# Patient Record
Sex: Female | Born: 1935
Health system: Southern US, Community
[De-identification: ages and names within clinical notes are randomized; demographics above are authoritative.]

## PROBLEM LIST (undated history)

## (undated) DIAGNOSIS — I10 Essential (primary) hypertension: Secondary | ICD-10-CM

## (undated) DIAGNOSIS — M797 Fibromyalgia: Secondary | ICD-10-CM

## (undated) DIAGNOSIS — I4891 Unspecified atrial fibrillation: Secondary | ICD-10-CM

## (undated) HISTORY — PX: ABDOMINAL HYSTERECTOMY: SHX81

## (undated) HISTORY — DX: Fibromyalgia: M79.7

## (undated) HISTORY — PX: APPENDECTOMY: SHX54

## (undated) HISTORY — DX: Essential (primary) hypertension: I10

## (undated) HISTORY — PX: TONSILLECTOMY: SUR1361

## (undated) HISTORY — PX: BREAST BIOPSY: SHX20

## (undated) HISTORY — DX: Unspecified atrial fibrillation: I48.91

---

## 2012-09-15 ENCOUNTER — Ambulatory Visit (HOSPITAL_BASED_OUTPATIENT_CLINIC_OR_DEPARTMENT_OTHER)
Admission: RE | Admit: 2012-09-15 | Discharge: 2012-09-15 | Disposition: A | Discharge status: Home / Self Care | Payer: Medicare Other | Source: Ambulatory Visit | Attending: Family Medicine | Admitting: Family Medicine

## 2012-09-15 ENCOUNTER — Other Ambulatory Visit (HOSPITAL_BASED_OUTPATIENT_CLINIC_OR_DEPARTMENT_OTHER): Payer: Self-pay | Admitting: Family Medicine

## 2012-09-15 DIAGNOSIS — M79609 Pain in unspecified limb: Secondary | ICD-10-CM | POA: Insufficient documentation

## 2012-09-15 DIAGNOSIS — M2010 Hallux valgus (acquired), unspecified foot: Secondary | ICD-10-CM | POA: Insufficient documentation

## 2013-04-22 ENCOUNTER — Other Ambulatory Visit: Payer: Self-pay | Admitting: Family Medicine

## 2013-04-28 ENCOUNTER — Other Ambulatory Visit: Payer: Self-pay | Admitting: *Deleted

## 2013-04-28 MED ORDER — ATENOLOL 25 MG PO TABS: 25.0000 mg | ORAL_TABLET | Freq: Every day | ORAL | Status: DC

## 2013-04-28 NOTE — Telephone Encounter (Signed)
Patient has ran out of her Atenolol.  She is schedule to come soon for labs and OV.

## 2013-04-30 ENCOUNTER — Ambulatory Visit: Payer: Medicare Other | Admitting: *Deleted

## 2013-04-30 DIAGNOSIS — E785 Hyperlipidemia, unspecified: Secondary | ICD-10-CM

## 2013-04-30 DIAGNOSIS — R5381 Other malaise: Secondary | ICD-10-CM

## 2013-04-30 LAB — COMPLETE METABOLIC PANEL WITH GFR
ALT: 13 U/L (ref 0–35)
AST: 18 U/L (ref 0–37)
Albumin: 4.3 g/dL (ref 3.5–5.2)
Alkaline Phosphatase: 58 U/L (ref 39–117)
BUN: 14 mg/dL (ref 6–23)
CO2: 31 mEq/L (ref 19–32)
Calcium: 9.3 mg/dL (ref 8.4–10.5)
Chloride: 101 mEq/L (ref 96–112)
Creat: 0.88 mg/dL (ref 0.50–1.10)
GFR, Est African American: 74 mL/min
GFR, Est Non African American: 64 mL/min
Glucose, Bld: 82 mg/dL (ref 70–99)
Potassium: 4.5 mEq/L (ref 3.5–5.3)
Sodium: 140 mEq/L (ref 135–145)
Total Bilirubin: 0.5 mg/dL (ref 0.3–1.2)
Total Protein: 6.7 g/dL (ref 6.0–8.3)

## 2013-04-30 LAB — LIPID PANEL
Cholesterol: 196 mg/dL (ref 0–200)
HDL: 50 mg/dL (ref >39)
LDL Cholesterol: 114 mg/dL — ABNORMAL HIGH (ref 0–99)
Total CHOL/HDL Ratio: 3.9 Ratio
Triglycerides: 159 mg/dL — ABNORMAL HIGH (ref <150)
VLDL: 32 mg/dL (ref 0–40)

## 2013-05-04 ENCOUNTER — Ambulatory Visit (INDEPENDENT_AMBULATORY_CARE_PROVIDER_SITE_OTHER): Payer: Medicare Other | Admitting: Family Medicine

## 2013-05-04 ENCOUNTER — Encounter: Payer: Self-pay | Admitting: Family Medicine

## 2013-05-04 VITALS — BP 171/72 | HR 50 | Ht 62.0 in | Wt 148.0 lb

## 2013-05-04 DIAGNOSIS — I10 Essential (primary) hypertension: Secondary | ICD-10-CM

## 2013-05-04 DIAGNOSIS — M797 Fibromyalgia: Secondary | ICD-10-CM

## 2013-05-04 DIAGNOSIS — E559 Vitamin D deficiency, unspecified: Secondary | ICD-10-CM

## 2013-05-04 DIAGNOSIS — E785 Hyperlipidemia, unspecified: Secondary | ICD-10-CM

## 2013-05-04 DIAGNOSIS — IMO0001 Reserved for inherently not codable concepts without codable children: Secondary | ICD-10-CM

## 2013-05-04 DIAGNOSIS — Z780 Asymptomatic menopausal state: Secondary | ICD-10-CM

## 2013-05-04 MED ORDER — LISINOPRIL-HYDROCHLOROTHIAZIDE 20-12.5 MG PO TABS: 1.0000 | ORAL_TABLET | Freq: Every day | ORAL | Status: DC

## 2013-05-04 MED ORDER — TIZANIDINE HCL 4 MG PO TABS: ORAL_TABLET | ORAL | Status: DC

## 2013-05-04 MED ORDER — TRAMADOL HCL 50 MG PO TABS: 50.0000 mg | ORAL_TABLET | Freq: Four times a day (QID) | ORAL | Status: DC | PRN

## 2013-05-04 MED ORDER — ATENOLOL 25 MG PO TABS: 25.0000 mg | ORAL_TABLET | Freq: Every day | ORAL | Status: DC

## 2013-05-04 MED ORDER — DULOXETINE HCL 20 MG PO CPEP: 20.0000 mg | ORAL_CAPSULE | Freq: Every day | ORAL | Status: DC

## 2013-05-04 MED ORDER — ERGOCALCIFEROL 1.25 MG (50000 UT) PO CAPS: 50000.0000 [IU] | ORAL_CAPSULE | ORAL | Status: DC

## 2013-05-04 NOTE — Progress Notes (Signed)
  Subjective:    Patient ID: Julie Ramos, female    DOB: September 12, 1934, 79 y.o.   MRN: [id number]  HPI  Julie Ramos is here today to go over her most recent lab results and to discuss the issues below.  She needs to have all of her medications refilled.    1)  Hypertension:  She has done well on the combination of atenolol and lisinopril/HCTZ.  She needs refill on both.   2)  Stress:  Work continues to be stressful for her. She is hoping that she can retire soon.     3)  Fibromyalgia:  Her generalized pain is pretty well controlled on tizanidine and tramadol.  She needs refills on both medicaitons.    Review of Systems  Constitutional: Negative for fatigue.  HENT: Negative for trouble swallowing.   Eyes: Negative for visual disturbance.  Respiratory: Negative for cough.   Musculoskeletal: Positive for myalgias and arthralgias. Negative for back pain and joint swelling.       Her pain is better than in the past but she still has some mild pain especially when she has to work more and when she is under more stress    Skin: Negative for color change and rash.  Neurological: Negative for light-headedness.  Psychiatric/Behavioral: The patient is not nervous/anxious.     Past Medical History  Diagnosis Date  . Hypertension   . Fibromyalgia   . Atrial fibrillation     Family History  Problem Relation Age of Onset  . Hypertension Mother   . 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 No distress.  HENT:  Head: Normocephalic.  Eyes: No scleral  icterus.  Neck: No thyromegaly present.  Cardiovascular: Normal rate, regular rhythm and normal heart sounds.   Pulmonary/Chest: Effort normal and breath sounds normal.  Abdominal: There is no tenderness.  Musculoskeletal: She exhibits no edema and no tenderness.  Neurological: She is alert.  Skin: Skin is warm and dry.  Psychiatric: She has a normal mood and affect. Her behavior is normal. Judgment and thought content normal.          Assessment & Plan:

## 2013-05-04 NOTE — Patient Instructions (Addendum)
1)  Muscle Aches/Pain - Hot Bath with Epsom Salt   2)  BP - Limit your intake of sodium and increase your lisinopril HCT to 1 pill.  3)  Cholesterol - Decrease red meat and increase your fiber.    Fat and Cholesterol Control Diet Cholesterol levels in your body are determined significantly by your diet. Cholesterol levels may also be related to heart disease. The following material helps to explain this relationship and discusses what you can do to help keep your heart healthy. Not all cholesterol is bad. Low-density lipoprotein (LDL) cholesterol is the "bad" cholesterol. It may cause fatty deposits to build up inside your arteries. High-density lipoprotein (HDL) cholesterol is "good." It helps to remove the "bad" LDL cholesterol from your blood. Cholesterol is a very important risk factor for heart disease. Other risk factors are high blood pressure, smoking, stress, heredity, and weight. The heart muscle gets its supply of blood through the coronary arteries. If your LDL cholesterol is high and your HDL cholesterol is low, you are at risk for having fatty deposits build up in your coronary arteries. This leaves less room through which blood can flow. Without sufficient blood and oxygen, the heart muscle cannot function properly and you may feel chest pains (angina pectoris). When a coronary artery closes up entirely, a part of the heart muscle may die causing a heart attack (myocardial infarction). CHECKING CHOLESTEROL When your caregiver sends your blood to a lab to be examined for cholesterol, a complete lipid (fat) profile may be done. With this test, the total amount of cholesterol and levels of LDL and HDL are determined. Triglycerides are a type of fat that circulates in the blood. They can also be used to determine heart disease risk. The list below describes what the numbers should be: Test: Total Cholesterol.  Less than 200 mg/dl. Test: LDL "bad cholesterol."  Less than 100 mg/dl.  Less  than 70 mg/dl if you are at very high risk of a heart attack or sudden cardiac death. Test: HDL "good cholesterol."  Greater than 50 mg/dl for women.  Greater than 40 mg/dl for men. Test: Triglycerides.  Less than 150 mg/dl. CONTROLLING CHOLESTEROL WITH DIET Although exercise and lifestyle factors are important, your diet is key. That is because certain foods are known to raise cholesterol and others to lower it. The goal is to balance foods for their effect on cholesterol and more importantly, to replace saturated and trans fat with other types of fat, such as monounsaturated fat, polyunsaturated fat, and omega-3 fatty acids. On average, a person should consume no more than 15 to 17 g of saturated fat daily. Saturated and trans fats are considered "bad" fats, and they will raise LDL cholesterol. Saturated fats are primarily found in animal products such as meats, butter, and cream. However, that does not mean you need to give up all your favorite foods. Today, there are good tasting, low-fat, low-cholesterol substitutes for most of the things you like to eat. Choose low-fat or nonfat alternatives. Choose round or loin cuts of red meat. These types of cuts are lowest in fat and cholesterol. Chicken (without the skin), fish, veal, and ground [place] breast are great choices. Eliminate fatty meats, such as hot dogs and salami. Even shellfish have little or no saturated fat. Have a 3 oz (85 g) portion when you eat lean meat, poultry, or fish. Trans fats are also called "partially hydrogenated oils." They are oils that have been scientifically manipulated so that they  are solid at room temperature resulting in a longer shelf life and improved taste and texture of foods in which they are added. Trans fats are found in stick margarine, some tub margarines, cookies, crackers, and baked goods.  When baking and cooking, oils are a great substitute for butter. The monounsaturated oils are especially beneficial  since it is believed they lower LDL and raise HDL. The oils you should avoid entirely are saturated tropical oils, such as coconut and palm.  Remember to eat a lot from food groups that are naturally free of saturated and trans fat, including fish, fruit, vegetables, beans, grains (barley, rice, couscous, bulgur wheat), and pasta (without cream sauces).  IDENTIFYING FOODS THAT LOWER CHOLESTEROL  Soluble fiber may lower your cholesterol. This type of fiber is found in fruits such as apples, vegetables such as broccoli, potatoes, and carrots, legumes such as beans, peas, and lentils, and grains such as barley. Foods fortified with plant sterols (phytosterol) may also lower cholesterol. You should eat at least 2 g per day of these foods for a cholesterol lowering effect.  Read package labels to identify low-saturated fats, trans fat free, and low-fat foods at the supermarket. Select cheeses that have only 2 to 3 g saturated fat per ounce. Use a heart-healthy tub margarine that is free of trans fats or partially hydrogenated oil. When buying baked goods (cookies, crackers), avoid partially hydrogenated oils. Breads and muffins should be made from whole grains (whole-wheat or whole oat flour, instead of "flour" or "enriched flour"). Buy non-creamy canned soups with reduced salt and no added fats.  FOOD PREPARATION TECHNIQUES  Never deep-fry. If you must fry, either stir-fry, which uses very little fat, or use non-stick cooking sprays. When possible, broil, bake, or roast meats, and steam vegetables. Instead of putting butter or margarine on vegetables, use lemon and herbs, applesauce, and cinnamon (for squash and sweet potatoes), nonfat yogurt, salsa, and low-fat dressings for salads.  LOW-SATURATED FAT / LOW-FAT FOOD SUBSTITUTES Meats / Saturated Fat (g)  Avoid: Steak, marbled (3 oz/85 g) / 11 g  Choose: Steak, lean (3 oz/85 g) / 4 g  Avoid: Hamburger (3 oz/85 g) / 7 g  Choose: Hamburger, lean (3 oz/85  g) / 5 g  Avoid: Ham (3 oz/85 g) / 6 g  Choose: Ham, lean cut (3 oz/85 g) / 2.4 g  Avoid: Chicken, with skin, dark meat (3 oz/85 g) / 4 g  Choose: Chicken, skin removed, dark meat (3 oz/85 g) / 2 g  Avoid: Chicken, with skin, light meat (3 oz/85 g) / 2.5 g  Choose: Chicken, skin removed, light meat (3 oz/85 g) / 1 g Dairy / Saturated Fat (g)  Avoid: Whole milk (1 cup) / 5 g  Choose: Low-fat milk, 2% (1 cup) / 3 g  Choose: Low-fat milk, 1% (1 cup) / 1.5 g  Choose: Skim milk (1 cup) / 0.3 g  Avoid: Hard cheese (1 oz/28 g) / 6 g  Choose: Skim milk cheese (1 oz/28 g) / 2 to 3 g  Avoid: Cottage cheese, 4% fat (1 cup) / 6.5 g  Choose: Low-fat cottage cheese, 1% fat (1 cup) / 1.5 g  Avoid: Ice cream (1 cup) / 9 g  Choose: Sherbet (1 cup) / 2.5 g  Choose: Nonfat frozen yogurt (1 cup) / 0.3 g  Choose: Frozen fruit bar / trace  Avoid: Whipped cream (1 tbs) / 3.5 g  Choose: Nondairy whipped topping (1 tbs) / 1 g Condiments /  Saturated Fat (g)  Avoid: Mayonnaise (1 tbs) / 2 g  Choose: Low-fat mayonnaise (1 tbs) / 1 g  Avoid: Butter (1 tbs) / 7 g  Choose: Extra light margarine (1 tbs) / 1 g  Avoid: Coconut oil (1 tbs) / 11.8 g  Choose: Olive oil (1 tbs) / 1.8 g  Choose: Corn oil (1 tbs) / 1.7 g  Choose: Safflower oil (1 tbs) / 1.2 g  Choose: Sunflower oil (1 tbs) / 1.4 g  Choose: Soybean oil (1 tbs) / 2.4 g  Choose: Canola oil (1 tbs) / 1 g Document Released: 11/11/2005 Document Revised: 02/03/2012 Document Reviewed: 05/02/2011 [employer] Patient Information 2014 [place], [place].

## 2013-05-04 NOTE — Progress Notes (Deleted)
  Subjective:    Patient ID: Julie Ramos, female    DOB: December 13, 1933, 79 y.o.   MRN: [id number]  HPI    Review of Systems  Constitutional: Negative.   HENT: Negative.   Eyes: Negative.   Respiratory: Negative.   Cardiovascular: Negative.   Endocrine: Negative.   Genitourinary: Negative.   Musculoskeletal: Positive for myalgias.  Skin: Negative.   Allergic/Immunologic: Negative.   Neurological: Negative.   Hematological: Negative.   Psychiatric/Behavioral: Negative.        Objective:   Physical Exam        Assessment & Plan:

## 2013-05-07 ENCOUNTER — Encounter: Payer: Medicare Other | Admitting: Family Medicine

## 2013-05-22 DIAGNOSIS — IMO0001 Reserved for inherently not codable concepts without codable children: Secondary | ICD-10-CM | POA: Insufficient documentation

## 2013-05-22 DIAGNOSIS — Z780 Asymptomatic menopausal state: Secondary | ICD-10-CM | POA: Insufficient documentation

## 2013-05-22 DIAGNOSIS — I10 Essential (primary) hypertension: Secondary | ICD-10-CM | POA: Insufficient documentation

## 2013-05-22 DIAGNOSIS — E785 Hyperlipidemia, unspecified: Secondary | ICD-10-CM | POA: Insufficient documentation

## 2013-05-22 DIAGNOSIS — M797 Fibromyalgia: Secondary | ICD-10-CM | POA: Insufficient documentation

## 2013-05-22 DIAGNOSIS — E559 Vitamin D deficiency, unspecified: Secondary | ICD-10-CM | POA: Insufficient documentation

## 2013-05-22 IMAGING — CR DG FOOT COMPLETE 3+V*L*
3 series · 3 of 3 positions shown · non-contrast
Comparison: None.

CLINICAL DATA: History of injury with contusion and pain.

LEFT FOOT - COMPLETE 3+ VIEW

[t foot ap left]
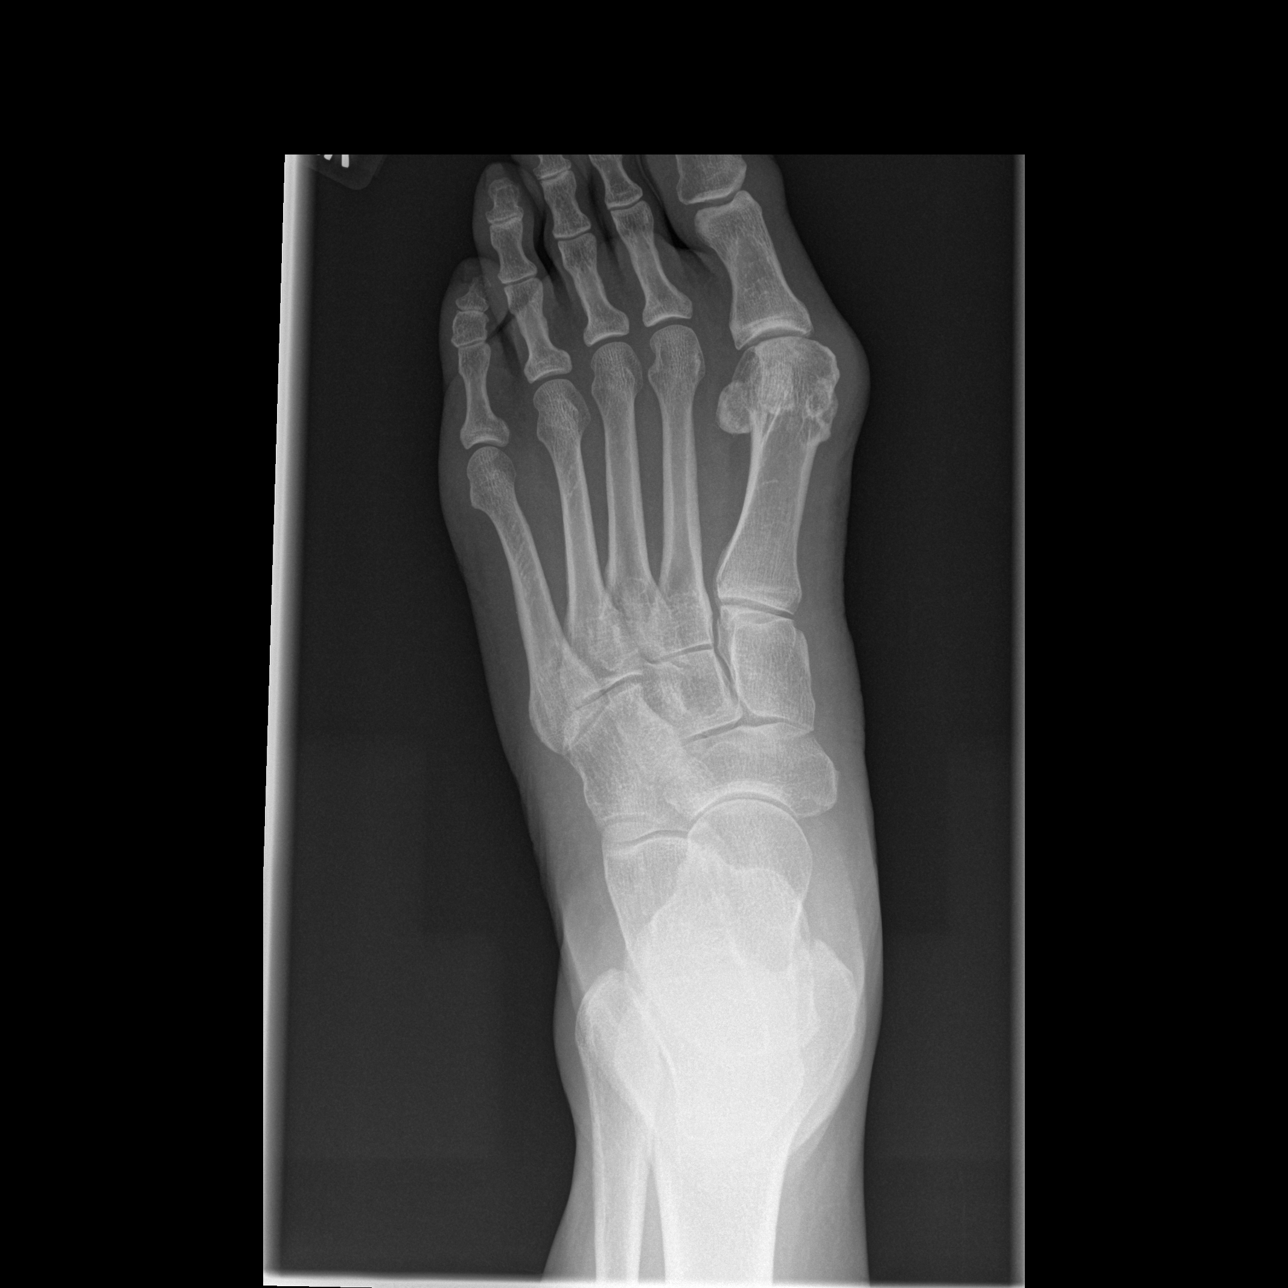

[t foot oblique left]
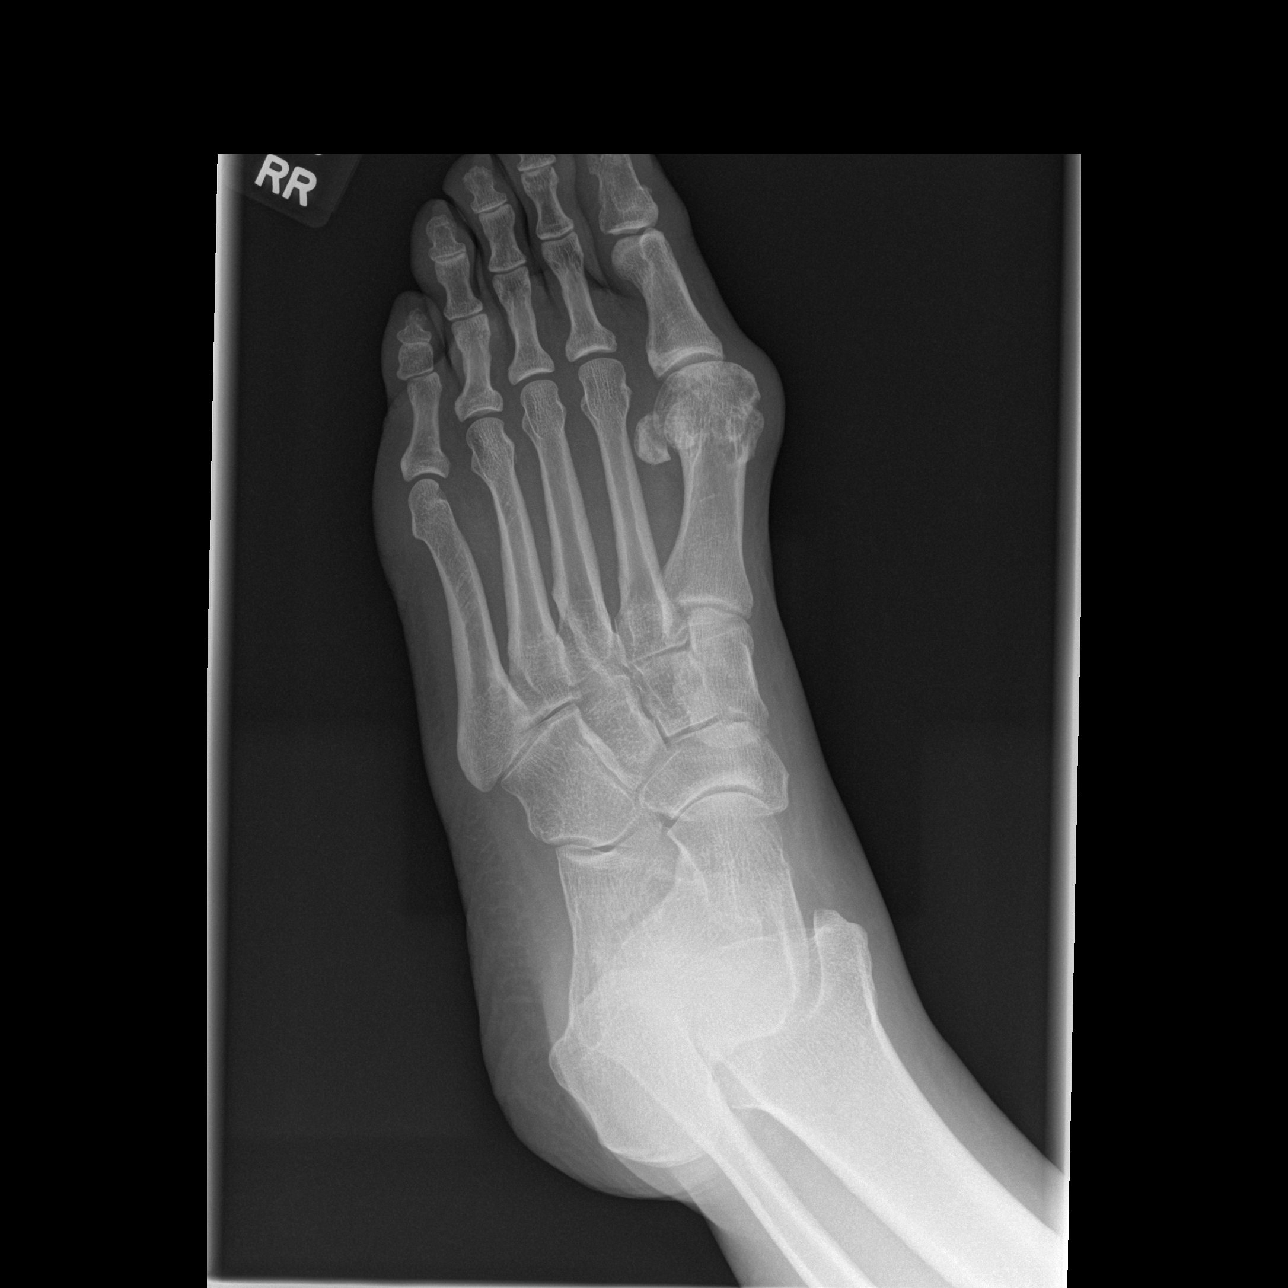

[t foot lat left]
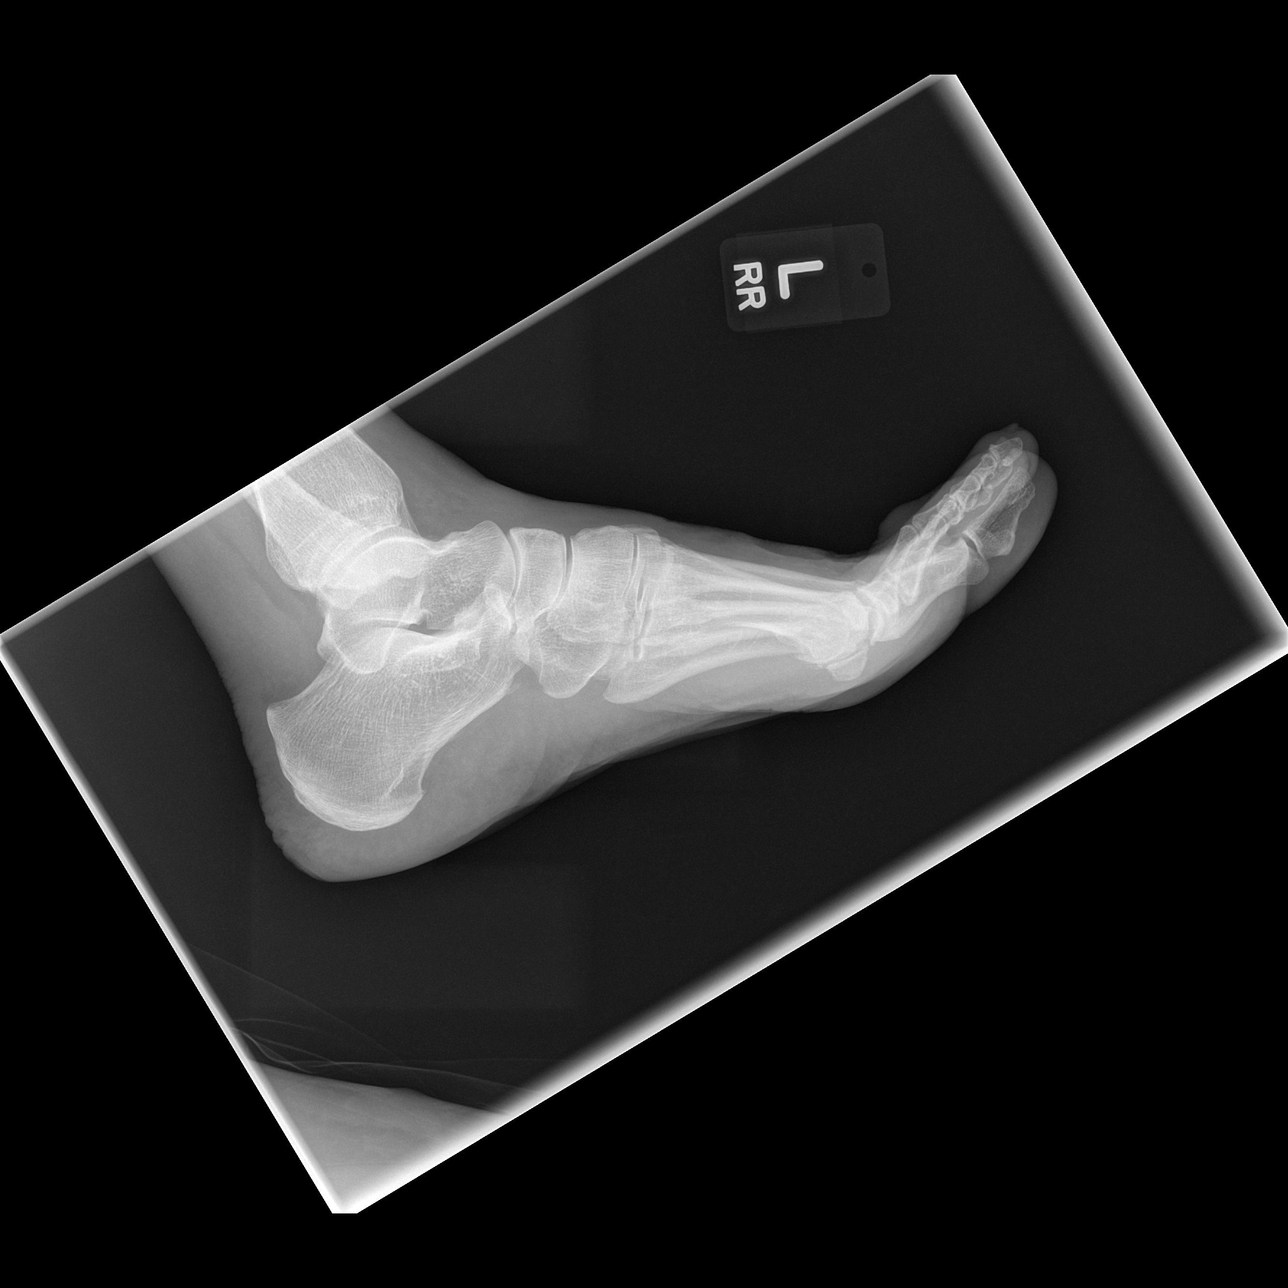

[3 of 3 positions shown; findings below may reference images not displayed]

FINDINGS: There is first metatarsal varus - hallux valgus
configuration with medial soft tissue thickening.  No fracture or
dislocation is evident.
IMPRESSION: Hallux valgus configuration.  No fracture or dislocation.

## 2013-05-22 NOTE — Assessment & Plan Note (Signed)
Refilled her Vitamin D.   

## 2013-05-22 NOTE — Assessment & Plan Note (Signed)
Her LDL is slightly elevated. She is to decrease her intake of red meat and increase her intake of fiber.

## 2013-05-22 NOTE — Assessment & Plan Note (Signed)
She is going to try a low dosage of Cymbalta to see if this will give her additional help with her pain.

## 2013-05-22 NOTE — Assessment & Plan Note (Signed)
Refilled her tramadol and tizanidine

## 2013-05-22 NOTE — Assessment & Plan Note (Addendum)
Her BP is higher than normal today.  She is to watch her sodium intake more closely. She is to also increase her lisinopril HCT to a full pill. She was given refills for both of her BP medications.

## 2013-05-22 NOTE — Assessment & Plan Note (Signed)
She is going to hold her HRT for now since she seems to be doing well.

## 2013-06-22 ENCOUNTER — Ambulatory Visit (INDEPENDENT_AMBULATORY_CARE_PROVIDER_SITE_OTHER): Payer: Medicare Other | Admitting: Family Medicine

## 2013-06-22 ENCOUNTER — Encounter: Payer: Self-pay | Admitting: Family Medicine

## 2013-06-22 VITALS — BP 144/80 | HR 53 | Resp 16 | Ht 62.0 in | Wt 147.0 lb

## 2013-06-22 DIAGNOSIS — I781 Nevus, non-neoplastic: Secondary | ICD-10-CM

## 2013-06-22 NOTE — Progress Notes (Signed)
  Subjective:    Patient ID: Julie Ramos, female    DOB: [birth year], 79 y.o.   MRN: [id number]  HPI  Julie Bible is here today concerned about a dark mole on her chest. She has had it for some time but feels that it is getting larger so she wants to have it checked. She also has an area under her right eye that she is concerned about. It has been there for years but recently noticed that it also is growing.    Review of Systems  Constitutional: Negative.   HENT: Negative.   Eyes:       Skin tag under right eye  Respiratory: Negative.   Cardiovascular: Negative.   Gastrointestinal: Negative.   Endocrine: Negative.   Genitourinary: Negative.   Musculoskeletal: Negative.   Skin:       Dark mole on chest  Allergic/Immunologic: Negative.   Neurological: Negative.   Hematological: Negative.   Psychiatric/Behavioral: Negative.     Past Medical History  Diagnosis Date  . Hypertension   . Fibromyalgia   . Atrial fibrillation     Family History  Problem Relation Age of Onset  . Hypertension Mother   . 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 No distress.  Skin: Skin is warm and dry.  She has one skin lesion on chest and one under right eye.        Assessment & Plan:

## 2013-08-01 DIAGNOSIS — I781 Nevus, non-neoplastic: Secondary | ICD-10-CM | POA: Insufficient documentation

## 2013-08-01 NOTE — Assessment & Plan Note (Signed)
She was given Dr. Stefanie Libel' address and phone number. She is to call and schedule an appointment to have these lesions checked since they are changing.

## 2013-08-01 NOTE — Patient Instructions (Addendum)
1)  Skin Lesion:  Call [facility] and set up an appointment with Dr. Stefanie Libel.

## 2013-08-24 ENCOUNTER — Ambulatory Visit (INDEPENDENT_AMBULATORY_CARE_PROVIDER_SITE_OTHER): Payer: Medicare Other | Admitting: Family Medicine

## 2013-08-24 VITALS — BP 130/80 | HR 54 | Resp 16 | Ht 61.25 in | Wt 145.0 lb

## 2013-08-24 DIAGNOSIS — Z0000 Encounter for general adult medical examination without abnormal findings: Secondary | ICD-10-CM

## 2013-08-24 DIAGNOSIS — Z23 Encounter for immunization: Secondary | ICD-10-CM

## 2013-08-24 DIAGNOSIS — IMO0001 Reserved for inherently not codable concepts without codable children: Secondary | ICD-10-CM

## 2013-08-24 LAB — POCT URINALYSIS DIPSTICK
Bilirubin, UA: NEGATIVE
Blood, UA: NEGATIVE
Glucose, UA: NEGATIVE
Ketones, UA: NEGATIVE
Leukocytes, UA: NEGATIVE
Nitrite, UA: NEGATIVE
Protein, UA: NEGATIVE
Spec Grav, UA: 1.02
Urobilinogen, UA: NEGATIVE
pH, UA: 5

## 2013-08-24 NOTE — Patient Instructions (Addendum)
Preventive Care for Adults, Female A healthy lifestyle and preventive care can promote health and wellness. Preventive health guidelines for women include the following key practices.  A routine yearly physical is a good way to check with your caregiver about your health and preventive screening. It is a chance to share any concerns and updates on your health, and to receive a thorough exam.  Visit your dentist for a routine exam and preventive care every 6 months. Brush your teeth twice a day and floss once a day. Good oral hygiene prevents tooth decay and gum disease.  The frequency of eye exams is based on your age, health, family medical history, use of contact lenses, and other factors. Follow your caregiver's recommendations for frequency of eye exams.  Eat a healthy diet. Foods like vegetables, fruits, whole grains, low-fat dairy products, and lean protein foods contain the nutrients you need without too many calories. Decrease your intake of foods high in solid fats, added sugars, and salt. Eat the right amount of calories for you.Get information about a proper diet from your caregiver, if necessary.  Regular physical exercise is one of the most important things you can do for your health. Most adults should get at least 150 minutes of moderate-intensity exercise (any activity that increases your heart rate and causes you to sweat) each week. In addition, most adults need muscle-strengthening exercises on 2 or more days a week.  Maintain a healthy weight. The body mass index (BMI) is a screening tool to identify possible weight problems. It provides an estimate of body fat based on height and weight. Your caregiver can help determine your BMI, and can help you achieve or maintain a healthy weight.For adults 20 years and older:  A BMI below 18.5 is considered underweight.  A BMI of 18.5 to 24.9 is normal.  A BMI of 25 to 29.9 is considered overweight.  A BMI of 30 and above is  considered obese.  Maintain normal blood lipids and cholesterol levels by exercising and minimizing your intake of saturated fat. Eat a balanced diet with plenty of fruit and vegetables. Blood tests for lipids and cholesterol should begin at age 20 and be repeated every 5 years. If your lipid or cholesterol levels are high, you are over 50, or you are at high risk for heart disease, you may need your cholesterol levels checked more frequently.Ongoing high lipid and cholesterol levels should be treated with medicines if diet and exercise are not effective.  If you smoke, find out from your caregiver how to quit. If you do not use tobacco, do not start.  If you are pregnant, do not drink alcohol. If you are breastfeeding, be very cautious about drinking alcohol. If you are not pregnant and choose to drink alcohol, do not exceed 1 drink per day. One drink is considered to be 12 ounces (355 mL) of beer, 5 ounces (148 mL) of wine, or 1.5 ounces (44 mL) of liquor.  Avoid use of street drugs. Do not share needles with anyone. Ask for help if you need support or instructions about stopping the use of drugs.  High blood pressure causes heart disease and increases the risk of stroke. Your blood pressure should be checked at least every 1 to 2 years. Ongoing high blood pressure should be treated with medicines if weight loss and exercise are not effective.  If you are 55 to 77 years old, ask your caregiver if you should take aspirin to prevent strokes.  Diabetes   screening involves taking a blood sample to check your fasting blood sugar level. This should be done once every 3 years, after age 45, if you are within normal weight and without risk factors for diabetes. Testing should be considered at a younger age or be carried out more frequently if you are overweight and have at least 1 risk factor for diabetes.  Breast cancer screening is essential preventive care for women. You should practice "breast  self-awareness." This means understanding the normal appearance and feel of your breasts and may include breast self-examination. Any changes detected, no matter how small, should be reported to a caregiver. Women in their 20s and 30s should have a clinical breast exam (CBE) by a caregiver as part of a regular health exam every 1 to 3 years. After age 40, women should have a CBE every year. Starting at age 40, women should consider having a mammography (breast X-ray test) every year. Women who have a family history of breast cancer should talk to their caregiver about genetic screening. Women at a high risk of breast cancer should talk to their caregivers about having magnetic resonance imaging (MRI) and a mammography every year.  The Pap test is a screening test for cervical cancer. A Pap test can show cell changes on the cervix that might become cervical cancer if left untreated. A Pap test is a procedure in which cells are obtained and examined from the lower end of the uterus (cervix).  Women should have a Pap test starting at age 21.  Between ages 21 and 29, Pap tests should be repeated every 2 years.  Beginning at age 30, you should have a Pap test every 3 years as long as the past 3 Pap tests have been normal.  Some women have medical problems that increase the chance of getting cervical cancer. Talk to your caregiver about these problems. It is especially important to talk to your caregiver if a new problem develops soon after your last Pap test. In these cases, your caregiver may recommend more frequent screening and Pap tests.  The above recommendations are the same for women who have or have not gotten the vaccine for human papillomavirus (HPV).  If you had a hysterectomy for a problem that was not cancer or a condition that could lead to cancer, then you no longer need Pap tests. Even if you no longer need a Pap test, a regular exam is a good idea to make sure no other problems are  starting.  If you are between ages 65 and 70, and you have had normal Pap tests going back 10 years, you no longer need Pap tests. Even if you no longer need a Pap test, a regular exam is a good idea to make sure no other problems are starting.  If you have had past treatment for cervical cancer or a condition that could lead to cancer, you need Pap tests and screening for cancer for at least 20 years after your treatment.  If Pap tests have been discontinued, risk factors (such as a new sexual partner) need to be reassessed to determine if screening should be resumed.  The HPV test is an additional test that may be used for cervical cancer screening. The HPV test looks for the virus that can cause the cell changes on the cervix. The cells collected during the Pap test can be tested for HPV. The HPV test could be used to screen women aged 30 years and older, and should   be used in women of any age who have unclear Pap test results. After the age of 30, women should have HPV testing at the same frequency as a Pap test.  Colorectal cancer can be detected and often prevented. Most routine colorectal cancer screening begins at the age of 50 and continues through age 75. However, your caregiver may recommend screening at an earlier age if you have risk factors for colon cancer. On a yearly basis, your caregiver may provide home test kits to check for hidden blood in the stool. Use of a small camera at the end of a tube, to directly examine the colon (sigmoidoscopy or colonoscopy), can detect the earliest forms of colorectal cancer. Talk to your caregiver about this at age 50, when routine screening begins. Direct examination of the colon should be repeated every 5 to 10 years through age 75, unless early forms of pre-cancerous polyps or small growths are found.  Hepatitis C blood testing is recommended for all people born from 1945 through 1965 and any individual with known risks for hepatitis C.  Practice  safe sex. Use condoms and avoid high-risk sexual practices to reduce the spread of sexually transmitted infections (STIs). STIs include gonorrhea, chlamydia, syphilis, trichomonas, herpes, HPV, and human immunodeficiency virus (HIV). Herpes, HIV, and HPV are viral illnesses that have no cure. They can result in disability, cancer, and death. Sexually active women aged 25 and younger should be checked for chlamydia. Older women with new or multiple partners should also be tested for chlamydia. Testing for other STIs is recommended if you are sexually active and at increased risk.  Osteoporosis is a disease in which the bones lose minerals and strength with aging. This can result in serious bone fractures. The risk of osteoporosis can be identified using a bone density scan. Women ages 65 and over and women at risk for fractures or osteoporosis should discuss screening with their caregivers. Ask your caregiver whether you should take a calcium supplement or vitamin D to reduce the rate of osteoporosis.  Menopause can be associated with physical symptoms and risks. Hormone replacement therapy is available to decrease symptoms and risks. You should talk to your caregiver about whether hormone replacement therapy is right for you.  Use sunscreen with sun protection factor (SPF) of 30 or more. Apply sunscreen liberally and repeatedly throughout the day. You should seek shade when your shadow is shorter than you. Protect yourself by wearing long sleeves, pants, a wide-brimmed hat, and sunglasses year round, whenever you are outdoors.  Once a month, do a whole body skin exam, using a mirror to look at the skin on your back. Notify your caregiver of new moles, moles that have irregular borders, moles that are larger than a pencil eraser, or moles that have changed in shape or color.  Stay current with required immunizations.  Influenza. You need a dose every fall (or winter). The composition of the flu vaccine  changes each year, so being vaccinated once is not enough.  Pneumococcal polysaccharide. You need 1 to 2 doses if you smoke cigarettes or if you have certain chronic medical conditions. You need 1 dose at age 65 (or older) if you have never been vaccinated.  Tetanus, diphtheria, pertussis (Tdap, Td). Get 1 dose of Tdap vaccine if you are younger than age 65, are over 65 and have contact with an infant, are a healthcare worker, are pregnant, or simply want to be protected from whooping cough. After that, you need a Td   booster dose every 10 years. Consult your caregiver if you have not had at least 3 tetanus and diphtheria-containing shots sometime in your life or have a deep or dirty wound.  HPV. You need this vaccine if you are a woman age 26 or younger. The vaccine is given in 3 doses over 6 months.  Measles, mumps, rubella (MMR). You need at least 1 dose of MMR if you were born in 1957 or later. You may also need a second dose.  Meningococcal. If you are age 19 to 21 and a first-year college student living in a residence hall, or have one of several medical conditions, you need to get vaccinated against meningococcal disease. You may also need additional booster doses.  Zoster (shingles). If you are age 60 or older, you should get this vaccine.  Varicella (chickenpox). If you have never had chickenpox or you were vaccinated but received only 1 dose, talk to your caregiver to find out if you need this vaccine.  Hepatitis A. You need this vaccine if you have a specific risk factor for hepatitis A virus infection or you simply wish to be protected from this disease. The vaccine is usually given as 2 doses, 6 to 18 months apart.  Hepatitis B. You need this vaccine if you have a specific risk factor for hepatitis B virus infection or you simply wish to be protected from this disease. The vaccine is given in 3 doses, usually over 6 months. Preventive Services / Frequency Ages 19 to 39  Blood  pressure check.** / Every 1 to 2 years.  Lipid and cholesterol check.** / Every 5 years beginning at age 20.  Clinical breast exam.** / Every 3 years for women in their 20s and 30s.  Pap test.** / Every 2 years from ages 21 through 29. Every 3 years starting at age 30 through age 65 or 70 with a history of 3 consecutive normal Pap tests.  HPV screening.** / Every 3 years from ages 30 through ages 65 to 70 with a history of 3 consecutive normal Pap tests.  Hepatitis C blood test.** / For any individual with known risks for hepatitis C.  Skin self-exam. / Monthly.  Influenza immunization.** / Every year.  Pneumococcal polysaccharide immunization.** / 1 to 2 doses if you smoke cigarettes or if you have certain chronic medical conditions.  Tetanus, diphtheria, pertussis (Tdap, Td) immunization. / A one-time dose of Tdap vaccine. After that, you need a Td booster dose every 10 years.  HPV immunization. / 3 doses over 6 months, if you are 26 and younger.  Measles, mumps, rubella (MMR) immunization. / You need at least 1 dose of MMR if you were born in 1957 or later. You may also need a second dose.  Meningococcal immunization. / 1 dose if you are age 19 to 21 and a first-year college student living in a residence hall, or have one of several medical conditions, you need to get vaccinated against meningococcal disease. You may also need additional booster doses.  Varicella immunization.** / Consult your caregiver.  Hepatitis A immunization.** / Consult your caregiver. 2 doses, 6 to 18 months apart.  Hepatitis B immunization.** / Consult your caregiver. 3 doses usually over 6 months. Ages 40 to 64  Blood pressure check.** / Every 1 to 2 years.  Lipid and cholesterol check.** / Every 5 years beginning at age 20.  Clinical breast exam.** / Every year after age 40.  Mammogram.** / Every year beginning at age 40   and continuing for as long as you are in good health. Consult with your  caregiver.  Pap test.** / Every 3 years starting at age 30 through age 65 or 70 with a history of 3 consecutive normal Pap tests.  HPV screening.** / Every 3 years from ages 30 through ages 65 to 70 with a history of 3 consecutive normal Pap tests.  Fecal occult blood test (FOBT) of stool. / Every year beginning at age 50 and continuing until age 75. You may not need to do this test if you get a colonoscopy every 10 years.  Flexible sigmoidoscopy or colonoscopy.** / Every 5 years for a flexible sigmoidoscopy or every 10 years for a colonoscopy beginning at age 50 and continuing until age 75.  Hepatitis C blood test.** / For all people born from 1945 through 1965 and any individual with known risks for hepatitis C.  Skin self-exam. / Monthly.  Influenza immunization.** / Every year.  Pneumococcal polysaccharide immunization.** / 1 to 2 doses if you smoke cigarettes or if you have certain chronic medical conditions.  Tetanus, diphtheria, pertussis (Tdap, Td) immunization.** / A one-time dose of Tdap vaccine. After that, you need a Td booster dose every 10 years.  Measles, mumps, rubella (MMR) immunization. / You need at least 1 dose of MMR if you were born in 1957 or later. You may also need a second dose.  Varicella immunization.** / Consult your caregiver.  Meningococcal immunization.** / Consult your caregiver.  Hepatitis A immunization.** / Consult your caregiver. 2 doses, 6 to 18 months apart.  Hepatitis B immunization.** / Consult your caregiver. 3 doses, usually over 6 months. Ages 65 and over  Blood pressure check.** / Every 1 to 2 years.  Lipid and cholesterol check.** / Every 5 years beginning at age 20.  Clinical breast exam.** / Every year after age 40.  Mammogram.** / Every year beginning at age 40 and continuing for as long as you are in good health. Consult with your caregiver.  Pap test.** / Every 3 years starting at age 30 through age 65 or 70 with a 3  consecutive normal Pap tests. Testing can be stopped between 65 and 70 with 3 consecutive normal Pap tests and no abnormal Pap or HPV tests in the past 10 years.  HPV screening.** / Every 3 years from ages 30 through ages 65 or 70 with a history of 3 consecutive normal Pap tests. Testing can be stopped between 65 and 70 with 3 consecutive normal Pap tests and no abnormal Pap or HPV tests in the past 10 years.  Fecal occult blood test (FOBT) of stool. / Every year beginning at age 50 and continuing until age 75. You may not need to do this test if you get a colonoscopy every 10 years.  Flexible sigmoidoscopy or colonoscopy.** / Every 5 years for a flexible sigmoidoscopy or every 10 years for a colonoscopy beginning at age 50 and continuing until age 75.  Hepatitis C blood test.** / For all people born from 1945 through 1965 and any individual with known risks for hepatitis C.  Osteoporosis screening.** / A one-time screening for women ages 65 and over and women at risk for fractures or osteoporosis.  Skin self-exam. / Monthly.  Influenza immunization.** / Every year.  Pneumococcal polysaccharide immunization.** / 1 dose at age 65 (or older) if you have never been vaccinated.  Tetanus, diphtheria, pertussis (Tdap, Td) immunization. / A one-time dose of Tdap vaccine if you are over   65 and have contact with an infant, are a healthcare worker, or simply want to be protected from whooping cough. After that, you need a Td booster dose every 10 years.  Varicella immunization.** / Consult your caregiver.  Meningococcal immunization.** / Consult your caregiver.  Hepatitis A immunization.** / Consult your caregiver. 2 doses, 6 to 18 months apart.  Hepatitis B immunization.** / Check with your caregiver. 3 doses, usually over 6 months. ** Family history and personal history of risk and conditions may change your caregiver's recommendations. Document Released: 01/07/2002 Document Revised: 02/03/2012  Document Reviewed: 04/08/2011 [employer] Patient Information 2014 [employer].  

## 2013-08-24 NOTE — Progress Notes (Signed)
Subjective:    Patient ID: Julie Ramos, female    DOB: 1934-01-16, 79 y.o.   MRN: [id number]  HPI  Kristell is here today for her annual CPE.  She has done well since her last office visit.  Overall she feels that her health is well except that she feels very sleepy.  She feels that her sleepiness has been happening since she started taking Cymbalta.     Review of Systems  Constitutional: Positive for fatigue.  HENT: Negative.   Eyes: Negative.   Respiratory: Negative.   Cardiovascular: Negative.   Gastrointestinal: Negative.   Endocrine: Negative.   Genitourinary: Negative.   Musculoskeletal: Negative.   Skin: Negative.   Allergic/Immunologic: Negative.   Neurological: Negative.   Hematological: Negative.   Psychiatric/Behavioral: Negative.      Past Medical History  Diagnosis Date  . Hypertension   . Fibromyalgia   . Atrial fibrillation      Past Surgical History  Procedure Laterality Date  . Abdominal hysterectomy    . Appendectomy    . Tonsillectomy    . Breast biopsy      Left     Family History  Problem Relation Age of Onset  . Hypertension Mother   . Heart disease Father      History   Social History Narrative   Marital Status: Widowed    Children:  Daughters (4); Son (1)    Pets: Charity fundraiser (1)    Living Situation: Lives alone   Occupation: Retired; Dispensing optician @ [employer]   Education: Engineer, agricultural    Tobacco Use/Exposure:  None    Alcohol Use:  Occasional   Drug Use:  None   Diet:  Regular   Exercise:  Active Job    Hobbies: Drawing/ Reading                Objective:   Physical Exam  Nursing note and vitals reviewed. Constitutional: She is oriented to person, place, and time. She appears well-developed and well-nourished.  HENT:  Head: Normocephalic and atraumatic.  Right Ear: External ear normal.  Left Ear: External ear normal.  Nose: Nose normal.  Mouth/Throat: Oropharynx is clear and moist.  Eyes: Conjunctivae and EOM are  normal. Pupils are equal, round, and reactive to light.  Neck: Normal range of motion. No thyromegaly present.  Cardiovascular: Normal rate, regular rhythm, normal heart sounds and intact distal pulses.  Exam reveals no gallop and no friction rub.   No murmur heard. Pulmonary/Chest: Effort normal and breath sounds normal. Right breast exhibits no inverted nipple, no mass, no nipple discharge, no skin change and no tenderness. Left breast exhibits no inverted nipple, no mass, no nipple discharge, no skin change and no tenderness. Breasts are symmetrical.  Abdominal: Soft. Bowel sounds are normal. Hernia confirmed negative in the right inguinal area and confirmed negative in the left inguinal area.  Genitourinary: Vagina normal. Pelvic exam was performed with patient supine. There is no rash, tenderness or lesion on the right labia. There is no rash, tenderness or lesion on the left labia. No vaginal discharge found.  Musculoskeletal: Normal range of motion. She exhibits no edema and no tenderness.  Lymphadenopathy:    She has no cervical adenopathy.       Right: No inguinal adenopathy present.       Left: No inguinal adenopathy present.  Neurological: She is alert and oriented to person, place, and time. She has normal reflexes.  Skin: Skin is warm and dry.  Psychiatric: She has a normal mood and affect. Her behavior is normal. Judgment and thought content normal.      Assessment & Plan:

## 2013-10-04 ENCOUNTER — Encounter: Payer: Self-pay | Admitting: Family Medicine

## 2013-10-04 ENCOUNTER — Ambulatory Visit (INDEPENDENT_AMBULATORY_CARE_PROVIDER_SITE_OTHER): Payer: Medicare Other | Admitting: Family Medicine

## 2013-10-04 VITALS — BP 159/79 | HR 54 | Resp 16 | Ht 61.25 in | Wt 147.0 lb

## 2013-10-04 DIAGNOSIS — R059 Cough, unspecified: Secondary | ICD-10-CM | POA: Insufficient documentation

## 2013-10-04 DIAGNOSIS — R0981 Nasal congestion: Secondary | ICD-10-CM | POA: Insufficient documentation

## 2013-10-04 DIAGNOSIS — J3489 Other specified disorders of nose and nasal sinuses: Secondary | ICD-10-CM

## 2013-10-04 DIAGNOSIS — R05 Cough: Secondary | ICD-10-CM | POA: Insufficient documentation

## 2013-10-04 MED ORDER — FLUTICASONE PROPIONATE 50 MCG/ACT NA SUSP: 2.0000 | Freq: Every day | NASAL | Status: DC

## 2013-10-04 MED ORDER — HYDROCODONE-HOMATROPINE 5-1.5 MG/5ML PO SYRP: 5.0000 mL | ORAL_SOLUTION | Freq: Four times a day (QID) | ORAL | Status: DC | PRN

## 2013-10-04 MED ORDER — BENZONATATE 200 MG PO CAPS: 200.0000 mg | ORAL_CAPSULE | Freq: Three times a day (TID) | ORAL | Status: DC | PRN

## 2013-10-04 NOTE — Assessment & Plan Note (Signed)
She was given prescriptions for cough medications.

## 2013-10-04 NOTE — Assessment & Plan Note (Signed)
She was given medications for her head congestion.

## 2013-10-04 NOTE — Patient Instructions (Signed)
1)  Sinus Pain/Pressure - Rinse out your head with a [facility] Med Sinus Rinse . Put warm distilled water in the bottle along with 1 of the blue packets and rinse out sinuses.  Take one Zyrtec at night and 1 Mucinex 2 times per day.  If you don't improve you can try the Flonase nasal spray or come in for a steroid shot.    Sinus Headache A sinus headache is when your sinuses become clogged or swollen. Sinus headaches can range from mild to severe.  CAUSES A sinus headache can have different causes, such as:  Colds.  Sinus infections.  Allergies. SYMPTOMS  Symptoms of a sinus headache may vary and can include:  Headache.  Pain or pressure in the face.  Congested or runny nose.  Fever.  Inability to smell.  Pain in upper teeth. Weather changes can make symptoms worse. TREATMENT  The treatment of a sinus headache depends on the cause.  Sinus pain caused by a sinus infection may be treated with antibiotic medicine.  Sinus pain caused by allergies may be helped by allergy medicines (antihistamines) and medicated nasal sprays.  Sinus pain caused by congestion may be helped by flushing the nose and sinuses with saline solution. HOME CARE INSTRUCTIONS   If antibiotics are prescribed, take them as directed. Finish them even if you start to feel better.  Only take over-the-counter or prescription medicines for pain, discomfort, or fever as directed by your caregiver.  If you have congestion, use a nasal spray to help reduce pressure. SEEK IMMEDIATE MEDICAL CARE IF:  You have a fever.  You have headaches more than once a week.  You have sensitivity to light or sound.  You have repeated nausea and vomiting.  You have vision problems.  You have sudden, severe pain in your face or head.  You have a seizure.  You are confused.  Your sinus headaches do not get better after treatment. Many people think they have a sinus headache when they actually have migraines or tension  headaches. MAKE SURE YOU:   Understand these instructions.  Will watch your condition.  Will get help right away if you are not doing well or get worse. Document Released: 12/19/2004 Document Revised: 02/03/2012 Document Reviewed: 02/09/2011 [facility] Patient Information 2014 [place], [place].

## 2013-10-04 NOTE — Progress Notes (Signed)
  Subjective:    Patient ID: Julie Ramos, female    DOB: 03-Nov-1934, 79 y.o.   MRN: [id number]  Julie Ramos is here today complaining of URI symptoms.    URI  This is a new problem. The current episode started 1 to 4 weeks ago. The problem has been gradually worsening. There has been no fever. Associated symptoms include congestion, coughing, headaches, rhinorrhea, sinus pain and sneezing. Pertinent negatives include no chest pain. She has tried nothing for the symptoms.     Review of Systems  Constitutional: Positive for fatigue. Negative for fever.  HENT: Positive for congestion, rhinorrhea and sneezing.   Eyes: Negative.   Respiratory: Positive for cough.   Cardiovascular: Negative for chest pain.  Gastrointestinal: Negative.   Endocrine: Negative.   Genitourinary: Negative.   Musculoskeletal: Negative.   Skin: Negative.   Allergic/Immunologic: Negative.   Neurological: Positive for headaches.  Hematological: Negative.   Psychiatric/Behavioral: Negative.      Past Medical History  Diagnosis Date  . Hypertension   . Fibromyalgia   . Atrial fibrillation      Family History  Problem Relation Age of Onset  . Hypertension Mother   . 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 Constitutional: She is oriented to person, place, and time. She appears well-developed and well-nourished.  HENT:  Head: Normocephalic.  Nose: Nose normal.  Mouth/Throat: No oropharyngeal exudate.  Eyes: Conjunctivae are normal. Pupils are equal, round, and  reactive to light. No scleral icterus.  Neck: Normal range of motion.  Cardiovascular: Normal rate and regular rhythm.   Pulmonary/Chest: Effort normal and breath sounds normal.  Abdominal: Soft.  Musculoskeletal: Normal range of motion.  Lymphadenopathy:    She has no cervical adenopathy.  Neurological: She is alert and oriented to person, place, and time.  Skin: Skin is warm and dry.  Psychiatric: She has a normal mood and affect. Her behavior is normal. Judgment and thought content normal.          Assessment & Plan:

## 2013-10-05 DIAGNOSIS — Z0000 Encounter for general adult medical examination without abnormal findings: Secondary | ICD-10-CM | POA: Insufficient documentation

## 2013-10-05 DIAGNOSIS — Z23 Encounter for immunization: Secondary | ICD-10-CM | POA: Insufficient documentation

## 2013-10-25 ENCOUNTER — Encounter: Payer: Self-pay | Admitting: Family Medicine

## 2013-10-25 NOTE — Assessment & Plan Note (Signed)
The patient confirmed that they are not allergic to eggs and have never had a bad reaction with the flu shot in the past.  The vaccination was given without difficulty.   

## 2013-12-16 ENCOUNTER — Ambulatory Visit: Payer: Medicare Other | Admitting: Family Medicine

## 2013-12-20 ENCOUNTER — Ambulatory Visit (INDEPENDENT_AMBULATORY_CARE_PROVIDER_SITE_OTHER): Payer: Medicare Other | Admitting: Family Medicine

## 2013-12-20 ENCOUNTER — Encounter: Payer: Self-pay | Admitting: Family Medicine

## 2013-12-20 ENCOUNTER — Encounter (INDEPENDENT_AMBULATORY_CARE_PROVIDER_SITE_OTHER): Payer: Self-pay

## 2013-12-20 VITALS — BP 139/56 | HR 50 | Resp 16 | Ht 61.25 in | Wt 147.0 lb

## 2013-12-20 DIAGNOSIS — E559 Vitamin D deficiency, unspecified: Secondary | ICD-10-CM

## 2013-12-20 DIAGNOSIS — IMO0001 Reserved for inherently not codable concepts without codable children: Secondary | ICD-10-CM

## 2013-12-20 DIAGNOSIS — I10 Essential (primary) hypertension: Secondary | ICD-10-CM

## 2013-12-20 MED ORDER — ERGOCALCIFEROL 1.25 MG (50000 UT) PO CAPS: 50000.0000 [IU] | ORAL_CAPSULE | ORAL | Status: AC

## 2013-12-20 MED ORDER — TRAMADOL HCL 50 MG PO TABS: ORAL_TABLET | ORAL | Status: DC

## 2013-12-20 MED ORDER — LISINOPRIL-HYDROCHLOROTHIAZIDE 20-12.5 MG PO TABS: 1.0000 | ORAL_TABLET | Freq: Every day | ORAL | Status: AC

## 2013-12-20 NOTE — Progress Notes (Signed)
Subjective:    Patient ID: Julie Ramos, female    DOB: 1934-05-07, 80 y.o.   MRN: [id number]  HPI  Lura Ematsy is here today to discuss the conditions listed below:   1)  Hypertension - She continues to do well with her combination of atenolol and lisiopril/HCTZ.  She needs both medications refilled.   2)  Fibromyalgia - She needs a refill on her Tramadol.   3)  Vitamin D Deficiency - She needs her Vitamin D refilled.     Review of Systems  Constitutional: Negative for activity change, fatigue and unexpected weight change.  HENT: Negative.   Eyes: Negative.   Respiratory: Negative for shortness of breath.   Cardiovascular: Negative for chest pain, palpitations and leg swelling.  Gastrointestinal: Negative for diarrhea and constipation.  Endocrine: Negative.   Genitourinary: Negative for difficulty urinating.  Musculoskeletal: Negative.  Negative for myalgias.  Skin: Negative.   Neurological: Negative.  Negative for light-headedness.  Hematological: Negative for adenopathy. Does not bruise/bleed easily.  Psychiatric/Behavioral: Negative for sleep disturbance and dysphoric mood. The patient is not nervous/anxious.   All other systems reviewed and are negative.    Past Medical History  Diagnosis Date  . Hypertension   . Fibromyalgia   . Atrial fibrillation      Past Surgical History  Procedure Laterality Date  . Abdominal hysterectomy    . Appendectomy    . Tonsillectomy    . 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 Hypertension  Mother   . Heart disease Father      Current Outpatient Prescriptions on File Prior to Visit  Medication Sig Dispense Refill  . atenolol (TENORMIN) 25 MG tablet Take 1 tablet (25 mg total) by mouth daily.  90 tablet  3   No current facility-administered medications on file prior to visit.     No Known Allergies   Immunization History  Administered Date(s) Administered  . Influenza,inj,Quad PF,36+ Mos 08/24/2013  . Pneumococcal Conjugate-13 06/06/2011  . Tdap 06/06/2011  . Zoster 07/24/2012       Objective:   Physical Exam  Vitals reviewed. Constitutional: She is oriented to person, place, and time.  Eyes: Conjunctivae are normal. No scleral icterus.  Neck: Neck supple. No thyromegaly present.  Cardiovascular: Normal rate, regular rhythm and normal heart sounds.   Pulmonary/Chest: Effort normal and breath sounds normal.  Musculoskeletal: She exhibits no edema and no tenderness.  Lymphadenopathy:    She has no cervical adenopathy.  Neurological: She is alert and oriented to person, place, and time.  Skin: Skin is warm and dry.  Psychiatric: She has a normal mood and affect. Her behavior is normal. Judgment and thought content normal.       Assessment & Plan:     Henny was seen today for medication refill.  Diagnoses and associated orders for this visit:  Essential hypertension, benign - lisinopril-hydrochlorothiazide (PRINZIDE,ZESTORETIC) 20-12.5 MG per tablet; Take 1 tablet by mouth daily.  Myalgia and myositis - traMADol (ULTRAM) 50 MG tablet; Take 1 tab up to TID as needed for pain.  Unspecified vitamin D deficiency - ergocalciferol (VITAMIN D2) 50000 UNITS capsule; Take 1 capsule (50,000 Units total) by mouth once a week.   TIME SPENT "FACE TO FACE" WITH PATIENT -  30 MINS

## 2014-01-21 ENCOUNTER — Encounter: Payer: Self-pay | Admitting: Family Medicine

## 2014-01-21 ENCOUNTER — Ambulatory Visit (INDEPENDENT_AMBULATORY_CARE_PROVIDER_SITE_OTHER): Payer: Medicare Other | Admitting: Family Medicine

## 2014-01-21 VITALS — BP 147/80 | HR 56 | Resp 16 | Wt 148.0 lb

## 2014-01-21 DIAGNOSIS — R300 Dysuria: Secondary | ICD-10-CM

## 2014-01-21 LAB — POCT URINALYSIS DIPSTICK
Bilirubin, UA: NEGATIVE
Glucose, UA: NEGATIVE
Ketones, UA: NEGATIVE
Nitrite, UA: NEGATIVE
Protein, UA: NEGATIVE
Spec Grav, UA: 1.01
Urobilinogen, UA: NEGATIVE
pH, UA: 6

## 2014-01-21 MED ORDER — SULFAMETHOXAZOLE-TMP DS 800-160 MG PO TABS: 1.0000 | ORAL_TABLET | Freq: Two times a day (BID) | ORAL | Status: DC

## 2014-01-21 MED ORDER — PHENAZOPYRIDINE HCL 200 MG PO TABS: ORAL_TABLET | ORAL | Status: DC

## 2014-01-21 NOTE — Progress Notes (Signed)
Subjective:    Patient ID: Julie Ramos, female    DOB: 09-Jul-1934, 80 y.o.   MRN: [id number]  Julie Ramos is here today because she feels that she may have a UTI.    Urinary Tract Infection  The current episode started 1 to 4 weeks ago. The problem occurs every urination. The quality of the pain is described as burning. The pain is at a severity of 8/10. The pain is severe. There has been no fever. She is not sexually active. There is no history of pyelonephritis. Associated symptoms include frequency and urgency. Pertinent negatives include no chills, flank pain or hematuria. She has tried increased fluids (Cranberry Juice ) for the symptoms.    Review of Systems  Constitutional: Negative for fever, chills, fatigue and unexpected weight change.  Gastrointestinal: Positive for abdominal pain.  Genitourinary: Positive for dysuria, urgency and frequency. Negative for hematuria, flank pain and difficulty urinating.  Musculoskeletal: Positive for back pain.  Skin: Negative for rash.     Past Medical History  Diagnosis Date  . Hypertension   . Fibromyalgia   . Atrial fibrillation      Past Surgical History  Procedure Laterality Date  . Abdominal hysterectomy    . Appendectomy    . Tonsillectomy    . 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 Hypertension Mother   . Heart disease Father      Current Outpatient Prescriptions on File Prior to Visit  Medication Sig Dispense Refill  . atenolol (TENORMIN) 25 MG tablet Take 1 tablet (25 mg total) by  mouth daily.  90 tablet  3  . ergocalciferol (VITAMIN D2) 50000 UNITS capsule Take 1 capsule (50,000 Units total) by mouth once a week.  12 capsule  3  . lisinopril-hydrochlorothiazide (PRINZIDE,ZESTORETIC) 20-12.5 MG per tablet Take 1 tablet by mouth daily.  90 tablet  3  . traMADol (ULTRAM) 50 MG tablet Take 1 tab up to TID as needed for pain.  90 tablet  5   No current facility-administered medications on file prior to visit.     No Known Allergies   Immunization History  Administered Date(s) Administered  . Influenza,inj,Quad PF,36+ Mos 08/24/2013  . Pneumococcal Conjugate-13 06/06/2011  . Tdap 06/06/2011  . Zoster 07/24/2012       Objective:   Physical Exam  Constitutional: She is oriented to person, place, and time. She appears well-nourished. No distress.  Eyes: No scleral icterus.  Cardiovascular: Normal rate and regular rhythm.   Pulmonary/Chest: Breath sounds normal.  Abdominal: There is tenderness.  Neurological: She is alert and oriented to person, place, and time.  Skin: No rash noted.  Psychiatric: She has a normal mood and affect. Her behavior is normal. Judgment and thought content normal.      Assessment & Plan:    Julie Ramos was seen today for dysuria.  Diagnoses and associated orders for this visit:  Dysuria - POCT urinalysis dipstick - Urine Culture - sulfamethoxazole-trimethoprim (BACTRIM DS) 800-160 MG per tablet; Take 1 tablet by mouth 2 (two) times daily. - phenazopyridine (PYRIDIUM) 200 MG tablet; Take 1 tab po TID for 3 days

## 2014-01-23 LAB — URINE CULTURE: Colony Count: >=100000

## 2014-01-24 ENCOUNTER — Telehealth: Payer: Self-pay | Admitting: *Deleted

## 2014-01-24 NOTE — Telephone Encounter (Signed)
Erick is aware of her urine culture results.  She was informed that Dr Alberteen SamZanard recommended to stop her antibiotic treatment if her symptoms have improved/resolved.  She said that her blood pressure diastolic number was 60 yesterday and this concerned her.  She only took her Atenolol and skipped her lisinopril.  She was instructed to continue monitoring her blood pressure if her blood pressure before and after taking her medications and  to contact us if her BP drops even more.  PG

## 2014-04-26 ENCOUNTER — Other Ambulatory Visit: Payer: Self-pay | Admitting: Family Medicine

## 2014-05-13 ENCOUNTER — Other Ambulatory Visit: Payer: Self-pay | Admitting: *Deleted

## 2014-05-13 MED ORDER — ATENOLOL 25 MG PO TABS: 25.0000 mg | ORAL_TABLET | Freq: Every day | ORAL | Status: DC

## 2014-05-23 ENCOUNTER — Other Ambulatory Visit: Payer: Self-pay | Admitting: *Deleted

## 2014-05-23 DIAGNOSIS — Z0000 Encounter for general adult medical examination without abnormal findings: Secondary | ICD-10-CM

## 2014-05-23 DIAGNOSIS — I10 Essential (primary) hypertension: Secondary | ICD-10-CM

## 2014-05-24 ENCOUNTER — Encounter: Payer: Self-pay | Admitting: *Deleted

## 2014-05-24 ENCOUNTER — Other Ambulatory Visit: Payer: Medicare Other

## 2014-05-24 LAB — CBC WITH DIFFERENTIAL/PLATELET
Basophils Absolute: 0.1 10*3/uL (ref 0.0–0.1)
Basophils Relative: 1 % (ref 0–1)
Eosinophils Absolute: 0.3 10*3/uL (ref 0.0–0.7)
Eosinophils Relative: 6 % — ABNORMAL HIGH (ref 0–5)
HCT: 36.3 % (ref 36.0–46.0)
Hemoglobin: 12.7 g/dL (ref 12.0–15.0)
Lymphocytes Relative: 35 % (ref 12–46)
Lymphs Abs: 1.9 10*3/uL (ref 0.7–4.0)
MCH: 31.9 pg (ref 26.0–34.0)
MCHC: 35 g/dL (ref 30.0–36.0)
MCV: 91.2 fL (ref 78.0–100.0)
Monocytes Absolute: 0.6 10*3/uL (ref 0.1–1.0)
Monocytes Relative: 10 % (ref 3–12)
Neutro Abs: 2.6 10*3/uL (ref 1.7–7.7)
Neutrophils Relative %: 48 % (ref 43–77)
Platelets: 211 10*3/uL (ref 150–400)
RBC: 3.98 MIL/uL (ref 3.87–5.11)
RDW: 14.1 % (ref 11.5–15.5)
WBC: 5.5 10*3/uL (ref 4.0–10.5)

## 2014-05-24 LAB — COMPLETE METABOLIC PANEL WITH GFR
ALT: 12 U/L (ref 0–35)
AST: 15 U/L (ref 0–37)
Albumin: 4.1 g/dL (ref 3.5–5.2)
Alkaline Phosphatase: 64 U/L (ref 39–117)
BUN: 14 mg/dL (ref 6–23)
CO2: 32 mEq/L (ref 19–32)
Calcium: 9.2 mg/dL (ref 8.4–10.5)
Chloride: 103 mEq/L (ref 96–112)
Creat: 0.76 mg/dL (ref 0.50–1.10)
GFR, Est African American: 88 mL/min
GFR, Est Non African American: 76 mL/min
Glucose, Bld: 83 mg/dL (ref 70–99)
Potassium: 4.4 mEq/L (ref 3.5–5.3)
Sodium: 140 mEq/L (ref 135–145)
Total Bilirubin: 0.4 mg/dL (ref 0.2–1.2)
Total Protein: 6 g/dL (ref 6.0–8.3)

## 2014-05-24 LAB — LIPID PANEL
Cholesterol: 229 mg/dL — ABNORMAL HIGH (ref 0–200)
HDL: 49 mg/dL (ref >39)
LDL Cholesterol: 151 mg/dL — ABNORMAL HIGH (ref 0–99)
Total CHOL/HDL Ratio: 4.7 Ratio
Triglycerides: 147 mg/dL (ref <150)
VLDL: 29 mg/dL (ref 0–40)

## 2014-05-24 LAB — HM MAMMOGRAPHY

## 2014-05-25 LAB — TSH: TSH: 4.334 u[IU]/mL (ref 0.350–4.500)

## 2014-06-06 ENCOUNTER — Encounter: Payer: Self-pay | Admitting: Family Medicine

## 2014-06-06 ENCOUNTER — Ambulatory Visit (INDEPENDENT_AMBULATORY_CARE_PROVIDER_SITE_OTHER): Payer: Medicare Other | Admitting: Family Medicine

## 2014-06-06 VITALS — BP 138/66 | HR 47 | Resp 16 | Ht 61.25 in | Wt 147.0 lb

## 2014-06-06 DIAGNOSIS — I10 Essential (primary) hypertension: Secondary | ICD-10-CM

## 2014-06-06 DIAGNOSIS — Z780 Asymptomatic menopausal state: Secondary | ICD-10-CM

## 2014-06-06 DIAGNOSIS — N951 Menopausal and female climacteric states: Secondary | ICD-10-CM

## 2014-06-06 DIAGNOSIS — IMO0001 Reserved for inherently not codable concepts without codable children: Secondary | ICD-10-CM

## 2014-06-06 MED ORDER — ATENOLOL 25 MG PO TABS: 25.0000 mg | ORAL_TABLET | Freq: Every evening | ORAL | Status: AC

## 2014-06-06 MED ORDER — ESTRADIOL 1 MG PO TABS: 1.0000 mg | ORAL_TABLET | Freq: Every day | ORAL | Status: AC

## 2014-06-06 MED ORDER — TIZANIDINE HCL 4 MG PO TABS: ORAL_TABLET | ORAL | Status: AC

## 2014-06-06 MED ORDER — TRAMADOL HCL 50 MG PO TABS: 50.0000 mg | ORAL_TABLET | Freq: Two times a day (BID) | ORAL | Status: AC | PRN

## 2014-06-06 MED ORDER — DICLOFENAC SODIUM 1 % TD GEL: 4.0000 g | Freq: Four times a day (QID) | TRANSDERMAL | Status: AC

## 2014-06-06 NOTE — Patient Instructions (Signed)
1)  Muscle Pain - Try a combination of the tramadol (50 mg 1-2 x per day) plus Tylenol (1000 mg 1-2 x per day) plus Ibuprofen (200 mg 1-2 times per day).  Apply the Voltaren Gel as needed.  Try some Tiger Balm and soak in a hot tub with Epsom Salt.     2)  BP - Take the atenolol in the evening and 1/2 of the lisinopril/HCT in the am.     Muscle Pain Muscle pain (myalgia) may be caused by many things, including:  Overuse or muscle strain, especially if you are not in shape. This is the most common cause of muscle pain.  Injury.  Bruises.  Viruses, such as the flu.  Infectious diseases.  Fibromyalgia, which is a chronic condition that causes muscle tenderness, fatigue, and headache.  Autoimmune diseases, including lupus.  Certain drugs, including ACE inhibitors and statins. Muscle pain may be mild or severe. In most cases, the pain lasts only a short time and goes away without treatment. To diagnose the cause of your muscle pain, your health care provider will take your medical history. This means he or she will ask you when your muscle pain began and what has been happening. If you have not had muscle pain for very long, your health care provider may want to wait before doing much testing. If your muscle pain has lasted a long time, your health care provider may want to run tests right away. If your health care provider thinks your muscle pain may be caused by illness, you may need to have additional tests to rule out certain conditions.  Treatment for muscle pain depends on the cause. Home care is often enough to relieve muscle pain. Your health care provider may also prescribe anti-inflammatory medicine. HOME CARE INSTRUCTIONS Watch your condition for any changes. The following actions may help to lessen any discomfort you are feeling:  Only take over-the-counter or prescription medicines as directed by your health care provider.  Apply ice to the sore muscle:  Put ice in a plastic  bag.  Place a towel between your skin and the bag.  Leave the ice on for 15-20 minutes, 3-4 times a day.  You may alternate applying hot and cold packs to the muscle as directed by your health care provider.  If overuse is causing your muscle pain, slow down your activities until the pain goes away.  Remember that it is normal to feel some muscle pain after starting a workout program. Muscles that have not been used often will be sore at first.  Do regular, gentle exercises if you are not usually active.  Warm up before exercising to lower your risk of muscle pain.  Do not continue working out if the pain is very bad. Bad pain could mean you have injured a muscle. SEEK MEDICAL CARE IF:  Your muscle pain gets worse, and medicines do not help.  You have muscle pain that lasts longer than 3 days.  You have a rash or fever along with muscle pain.  You have muscle pain after a tick bite.  You have muscle pain while working out, even though you are in good physical condition.  You have redness, soreness, or swelling along with muscle pain.  You have muscle pain after starting a new medicine or changing the dose of a medicine. SEEK IMMEDIATE MEDICAL CARE IF:  You have trouble breathing.  You have trouble swallowing.  You have muscle pain along with a stiff neck,  fever, and vomiting.  You have severe muscle weakness or cannot move part of your body. MAKE SURE YOU:   Understand these instructions.  Will watch your condition.  Will get help right away if you are not doing well or get worse. Document Released: 10/03/2006 Document Revised: 11/16/2013 Document Reviewed: 09/07/2013 [facility] Patient Information 2015 [place], [place]. This information is not intended to replace advice given to you by your health care provider. Make sure you discuss any questions you have with your health care provider.

## 2014-06-06 NOTE — Progress Notes (Signed)
Subjective:    Patient ID: Julie Ramos, female    DOB: Apr 17, 1934, 80 y.o.   MRN: [id number]  HPI  Julie Ramos is here today to follow up on the following:  1)  Hypertension:  Her BP is well controlled on the combination of atenolol and lisinopril/HCTZ. She needs to have these medications refilled.     2)  Muscle/Joint Pain: She has been having pain in her shoulders and arms and recently saw Dr. Wyline Mood who did a joint injection. She said helped for a short time but now it seems to be getting worse.  3)  Menopause:  She needs to have her estrace refilled.     Review of Systems  Constitutional: Negative for activity change, appetite change and fatigue.  Cardiovascular: Negative for chest pain, palpitations and leg swelling.  Musculoskeletal: Positive for myalgias.       Pain in shoulder and arms  Neurological: Negative for headaches.  Psychiatric/Behavioral: Negative for behavioral problems. The patient is not nervous/anxious.   All other systems reviewed and are negative.    Past Medical History  Diagnosis Date  . Hypertension   . Fibromyalgia   . Atrial fibrillation      Past Surgical History  Procedure Laterality Date  . Abdominal hysterectomy    . Appendectomy    . Tonsillectomy    . 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 Hypertension Mother   . Heart disease Father      Current Outpatient Prescriptions on File Prior to Visit  Medication Sig Dispense Refill  . ergocalciferol (VITAMIN D2) 50000 UNITS capsule Take 1 capsule  (50,000 Units total) by mouth once a week.  12 capsule  3  . lisinopril-hydrochlorothiazide (PRINZIDE,ZESTORETIC) 20-12.5 MG per tablet Take 1 tablet by mouth daily.  90 tablet  3   No current facility-administered medications on file prior to visit.     No Known Allergies   Immunization History  Administered Date(s) Administered  . Influenza,inj,Quad PF,36+ Mos 08/24/2013  . Pneumococcal Conjugate-13 06/06/2011  . Tdap 06/06/2011  . Zoster 07/24/2012       Objective:   Physical Exam  Vitals reviewed. Constitutional: She is oriented to person, place, and time.  Eyes: Conjunctivae are normal. No scleral icterus.  Neck: Neck supple. No thyromegaly present.  Cardiovascular: Normal rate, regular rhythm and normal heart sounds.   Pulmonary/Chest: Effort normal and breath sounds normal.  Musculoskeletal: She exhibits tenderness (She has pain/stiffness in both shoulders.  ). She exhibits no edema.  Lymphadenopathy:    She has no cervical adenopathy.  Neurological: She is alert and oriented to person, place, and time.  Skin: Skin is warm and dry.  Psychiatric: She has a normal mood and affect. Her behavior is normal. Judgment and thought content normal.      Assessment & Plan:    Julie Ramos was  seen today for medication management.  Diagnoses and associated orders for this visit:  Essential hypertension, benign - atenolol (TENORMIN) 25 MG tablet; Take 1 tablet (25 mg total) by mouth every evening.  Myalgia and myositis Comments: She will contact Dr. Verna CzechWeller's office to see about getting another injection.   - traMADol (ULTRAM) 50 MG tablet; Take 1 tablet (50 mg total) by mouth every 12 (twelve) hours as needed for moderate pain. - tiZANidine (ZANAFLEX) 4 MG tablet; Take 1 pill po up to BID for muscle spasm - diclofenac sodium (VOLTAREN) 1 % GEL; Apply 4 g topically 4 (four) times daily.  Menopause - estradiol (ESTRACE) 1 MG tablet; Take 1 tablet (1 mg total) by mouth  daily.   TIME SPENT "FACE TO FACE" WITH PATIENT -  30 MINS

## 2014-11-19 ENCOUNTER — Encounter (HOSPITAL_BASED_OUTPATIENT_CLINIC_OR_DEPARTMENT_OTHER): Payer: Self-pay | Admitting: *Deleted

## 2014-11-19 ENCOUNTER — Emergency Department (HOSPITAL_BASED_OUTPATIENT_CLINIC_OR_DEPARTMENT_OTHER): Payer: Medicare Other

## 2014-11-19 ENCOUNTER — Emergency Department (HOSPITAL_BASED_OUTPATIENT_CLINIC_OR_DEPARTMENT_OTHER)
Admission: EM | Admit: 2014-11-19 | Discharge: 2014-11-19 | Disposition: A | Discharge status: Home / Self Care | Payer: Medicare Other | Attending: Emergency Medicine | Admitting: Emergency Medicine

## 2014-11-19 DIAGNOSIS — I10 Essential (primary) hypertension: Secondary | ICD-10-CM | POA: Diagnosis not present

## 2014-11-19 DIAGNOSIS — R059 Cough, unspecified: Secondary | ICD-10-CM

## 2014-11-19 DIAGNOSIS — B9789 Other viral agents as the cause of diseases classified elsewhere: Secondary | ICD-10-CM

## 2014-11-19 DIAGNOSIS — Z79899 Other long term (current) drug therapy: Secondary | ICD-10-CM | POA: Diagnosis not present

## 2014-11-19 DIAGNOSIS — Z791 Long term (current) use of non-steroidal anti-inflammatories (NSAID): Secondary | ICD-10-CM | POA: Insufficient documentation

## 2014-11-19 DIAGNOSIS — Z8739 Personal history of other diseases of the musculoskeletal system and connective tissue: Secondary | ICD-10-CM | POA: Diagnosis not present

## 2014-11-19 DIAGNOSIS — J069 Acute upper respiratory infection, unspecified: Secondary | ICD-10-CM | POA: Insufficient documentation

## 2014-11-19 DIAGNOSIS — R05 Cough: Secondary | ICD-10-CM

## 2014-11-19 DIAGNOSIS — J029 Acute pharyngitis, unspecified: Secondary | ICD-10-CM | POA: Diagnosis present

## 2014-11-19 DIAGNOSIS — Z7982 Long term (current) use of aspirin: Secondary | ICD-10-CM | POA: Insufficient documentation

## 2014-11-19 MED ORDER — DM-GUAIFENESIN ER 30-600 MG PO TB12: 1.0000 | ORAL_TABLET | Freq: Two times a day (BID) | ORAL | Status: AC

## 2014-11-19 NOTE — ED Provider Notes (Signed)
CSN: [id number]     Arrival date & time 11/19/14  1021 History   First MD Initiated Contact with Patient 11/19/14 1134     Chief Complaint  Patient presents with  . Sore Throat     Patient is a 80 y.o. female presenting with pharyngitis. The history is provided by the patient.  Sore Throat This is a new problem. The current episode started more than 2 days ago. The problem occurs daily. The problem has been gradually worsening. Pertinent negatives include no chest pain and no abdominal pain. The symptoms are aggravated by swallowing. The symptoms are relieved by rest.  pt reports cough/congestion/sore throat for past several days No fever No vomiting No SOB is reported No hemoptysis is reported   Past Medical History  Diagnosis Date  . Hypertension   . Fibromyalgia   . Atrial fibrillation    Past Surgical History  Procedure Laterality Date  . Abdominal hysterectomy    . Appendectomy    . Tonsillectomy    . Breast biopsy      Left   Family History  Problem Relation Age of Onset  . Hypertension Mother   . Heart disease Father    History  Substance Use Topics  . Smoking status: Never Smoker   . Smokeless tobacco: Never Used  . Alcohol Use: Yes   OB History    No data available     Review of Systems  Constitutional: Negative for fever.  Respiratory: Positive for cough.   Cardiovascular: Negative for chest pain.  Gastrointestinal: Negative for abdominal pain.  All other systems reviewed and are negative.     Allergies  Review of patient's allergies indicates no known allergies.  Home Medications   Prior to Admission medications   Medication Sig Start Date End Date Taking? Authorizing Provider  aspirin 81 MG tablet Take 81 mg by mouth daily.    Historical Provider, MD  atenolol (TENORMIN) 25 MG tablet Take 1 tablet (25 mg total) by mouth every evening. 06/06/14 06/06/15  Gillian Scarceobyn K Zanard, MD  dextromethorphan-guaiFENesin (MUCINEX DM) 30-600 MG per 12 hr tablet  Take 1 tablet by mouth 2 (two) times daily. 11/19/14   Joya Gaskinsonald W Ledell Codrington, MD  diclofenac sodium (VOLTAREN) 1 % GEL Apply 4 g topically 4 (four) times daily. 06/06/14 06/07/15  Gillian Scarceobyn K Zanard, MD  ergocalciferol (VITAMIN D2) 50000 UNITS capsule Take 1 capsule (50,000 Units total) by mouth once a week. 12/20/13 12/20/14  Gillian Scarceobyn K Zanard, MD  estradiol (ESTRACE) 1 MG tablet Take 1 tablet (1 mg total) by mouth daily. 06/06/14 06/07/15  Gillian Scarceobyn K Zanard, MD  lisinopril-hydrochlorothiazide (PRINZIDE,ZESTORETIC) 20-12.5 MG per tablet Take 1 tablet by mouth daily. 12/20/13 12/20/14  Gillian Scarceobyn K Zanard, MD  tiZANidine (ZANAFLEX) 4 MG tablet Take 1 pill po up to BID for muscle spasm 06/06/14 06/06/15  Gillian Scarceobyn K Zanard, MD  traMADol (ULTRAM) 50 MG tablet Take 1 tablet (50 mg total) by mouth every 12 (twelve) hours as needed for moderate pain. 06/06/14 06/07/15  Gillian Scarceobyn K Zanard, MD   BP 164/55 mmHg  Pulse 62  Temp(Src) 98.5 F (36.9 C) (Oral)  Resp 18  Ht 5' (1.524 m)  Wt 142 lb (64.411 kg)  BMI 27.73 kg/m2  SpO2 97% Physical Exam CONSTITUTIONAL: Well developed/well nourished HEAD: Normocephalic/atraumatic EYES: EOMI/PERRL ENMT: Mucous membranes moist, uvula midline without erythema/exudates noted NECK: supple no meningeal signs SPINE/BACK:entire spine nontender CV: S1/S2 noted, no murmurs/rubs/gallops noted LUNGS: Lungs are clear to auscultation bilaterally, no apparent distress  ABDOMEN: soft, nontender, no rebound or guarding, bowel sounds noted throughout abdomen GU:no cva tenderness NEURO: Pt is awake/alert/appropriate, moves all extremitiesx4.  No facial droop.   EXTREMITIES: pulses normal/equal, full ROM SKIN: warm, color normal PSYCH: no abnormalities of mood noted, alert and oriented to situation  ED Course  Procedures   Pt well appearing, no distress, no hypoxia Appropriate for d/c home She requests mucinex DM for home use  Imaging Review Dg Chest 2 View  11/19/2014   CLINICAL DATA:  Cough, sore  throat and shortness of breath with congestion 3 days.  EXAM: CHEST  2 VIEW  COMPARISON:  11/20/2011  FINDINGS: Lungs are adequately inflated without consolidation or effusion. The cardiomediastinal silhouette is within normal. There is mild degenerative change of the spine.  IMPRESSION: No active cardiopulmonary disease.   Electronically Signed   By: Elberta Fortisaniel  Boyle M.D.   On: 11/19/2014 11:20      MDM   Final diagnoses:  Viral URI with cough    Nursing notes including past medical history and social history reviewed and considered in documentation xrays/imaging reviewed by myself and considered during evaluation     Joya Gaskinsonald W [place]ar [place], MD 11/19/14 1528

## 2014-11-19 NOTE — Discharge Instructions (Signed)
Cough, Adult  A cough is a reflex that helps clear your throat and airways. It can help heal the body or may be a reaction to an irritated airway. A cough may only last 2 or 3 weeks (acute) or may last more than 8 weeks (chronic).  CAUSES Acute cough:  Viral or bacterial infections. Chronic cough:  Infections.  Allergies.  Asthma.  Post-nasal drip.  Smoking.  Heartburn or acid reflux.  Some medicines.  Chronic lung problems (COPD).  Cancer. SYMPTOMS   Cough.  Fever.  Chest pain.  Increased breathing rate.  High-pitched whistling sound when breathing (wheezing).  Colored mucus that you cough up (sputum). TREATMENT   A bacterial cough may be treated with antibiotic medicine.  A viral cough must run its course and will not respond to antibiotics.  Your caregiver may recommend other treatments if you have a chronic cough. HOME CARE INSTRUCTIONS   Only take over-the-counter or prescription medicines for pain, discomfort, or fever as directed by your caregiver. Use cough suppressants only as directed by your caregiver.  Use a cold steam vaporizer or humidifier in your bedroom or home to help loosen secretions.  Sleep in a semi-upright position if your cough is worse at night.  Rest as needed.  Stop smoking if you smoke. SEEK IMMEDIATE MEDICAL CARE IF:   You have pus in your sputum.  Your cough starts to worsen.  You cannot control your cough with suppressants and are losing sleep.  You begin coughing up blood.  You have difficulty breathing.  You develop pain which is getting worse or is uncontrolled with medicine.  You have a fever. MAKE SURE YOU:   Understand these instructions.  Will watch your condition.  Will get help right away if you are not doing well or get worse. Document Released: 05/10/2011 Document Revised: 02/03/2012 Document Reviewed: 05/10/2011 [employer] Patient Information 2015 [employer]. This information is not intended  to replace advice given to you by your health care provider. Make sure you discuss any questions you have with your health care provider.  

## 2014-11-19 NOTE — ED Notes (Signed)
Patient states that she began having a sore throat Tuesday, then developed a cough, congestion, and hoarse throat

## 2015-07-26 IMAGING — CR DG CHEST 2V
2 series · 2 of 2 positions shown · non-contrast
Comparison: 11/20/2011

CLINICAL DATA: Cough, sore throat and shortness of breath with
congestion 3 days.

EXAM:
CHEST  2 VIEW

[w chest pa]
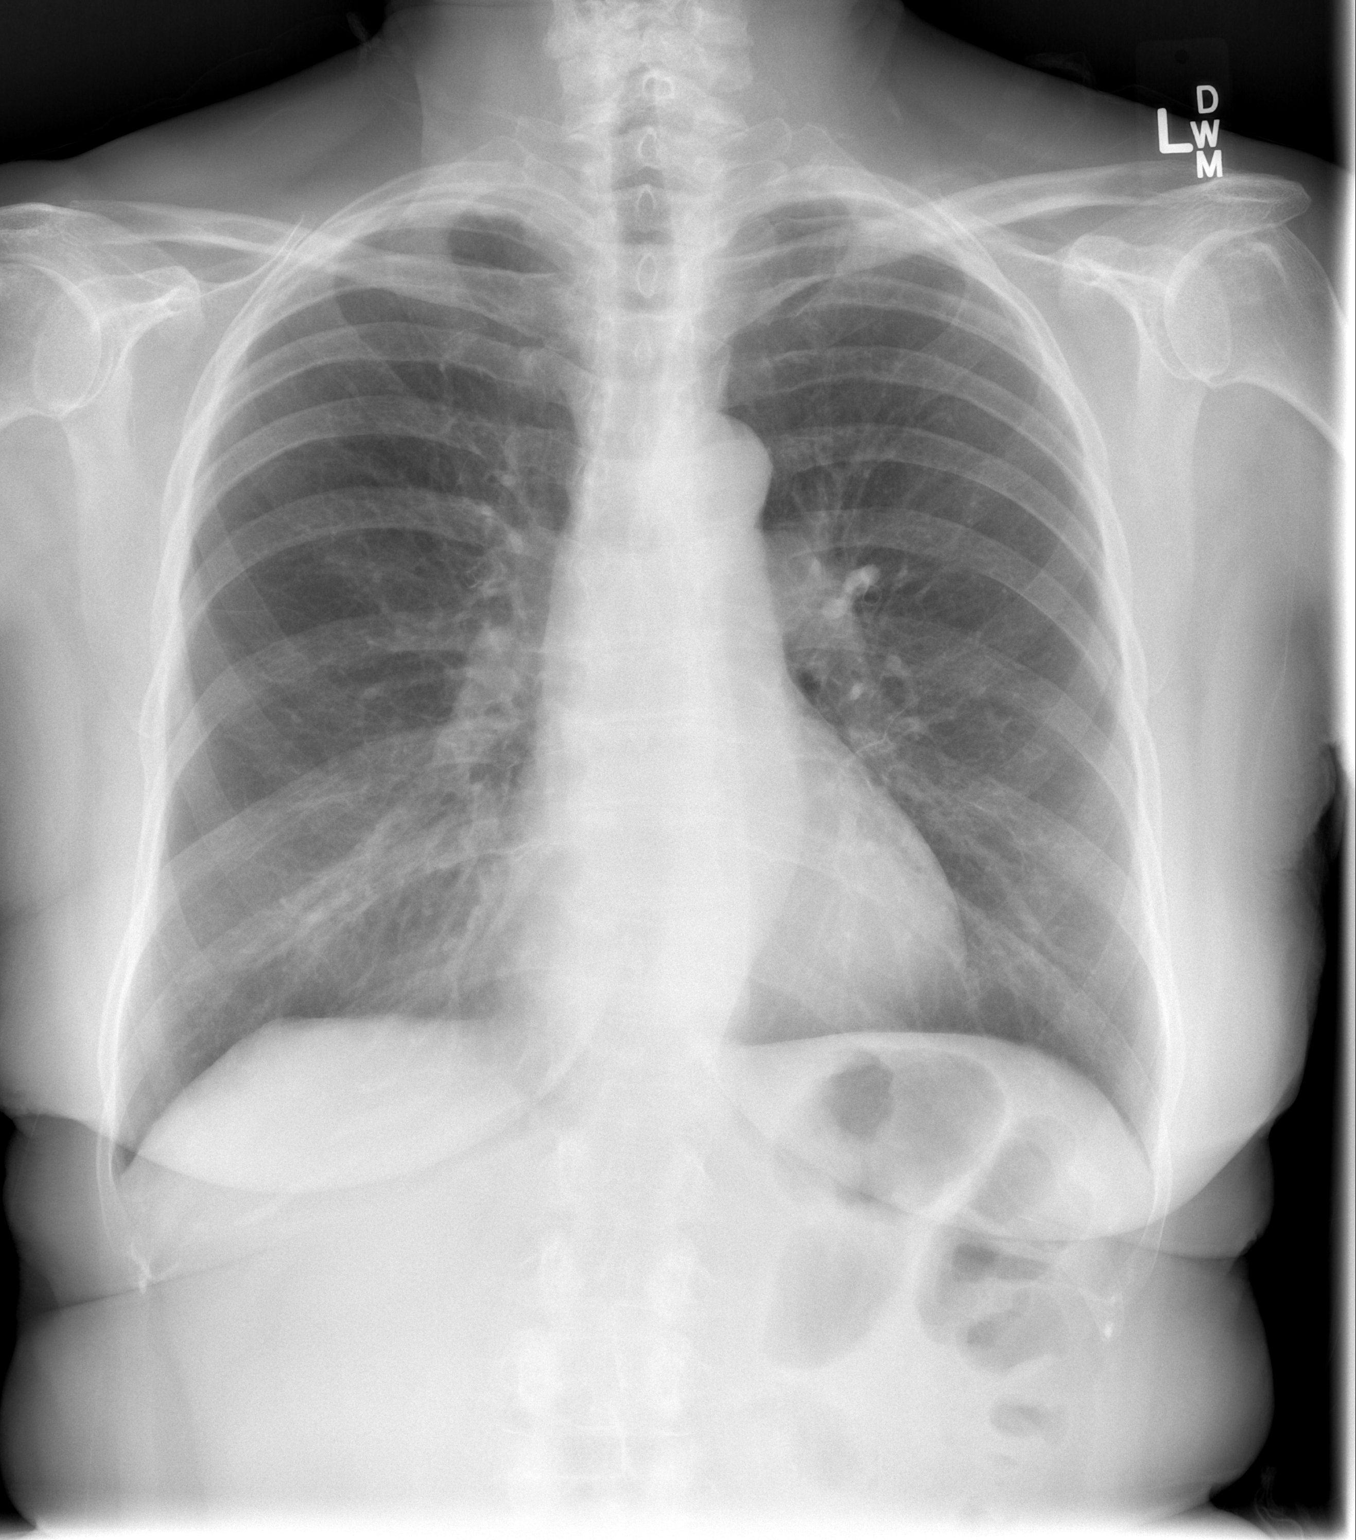

[w chest lat]
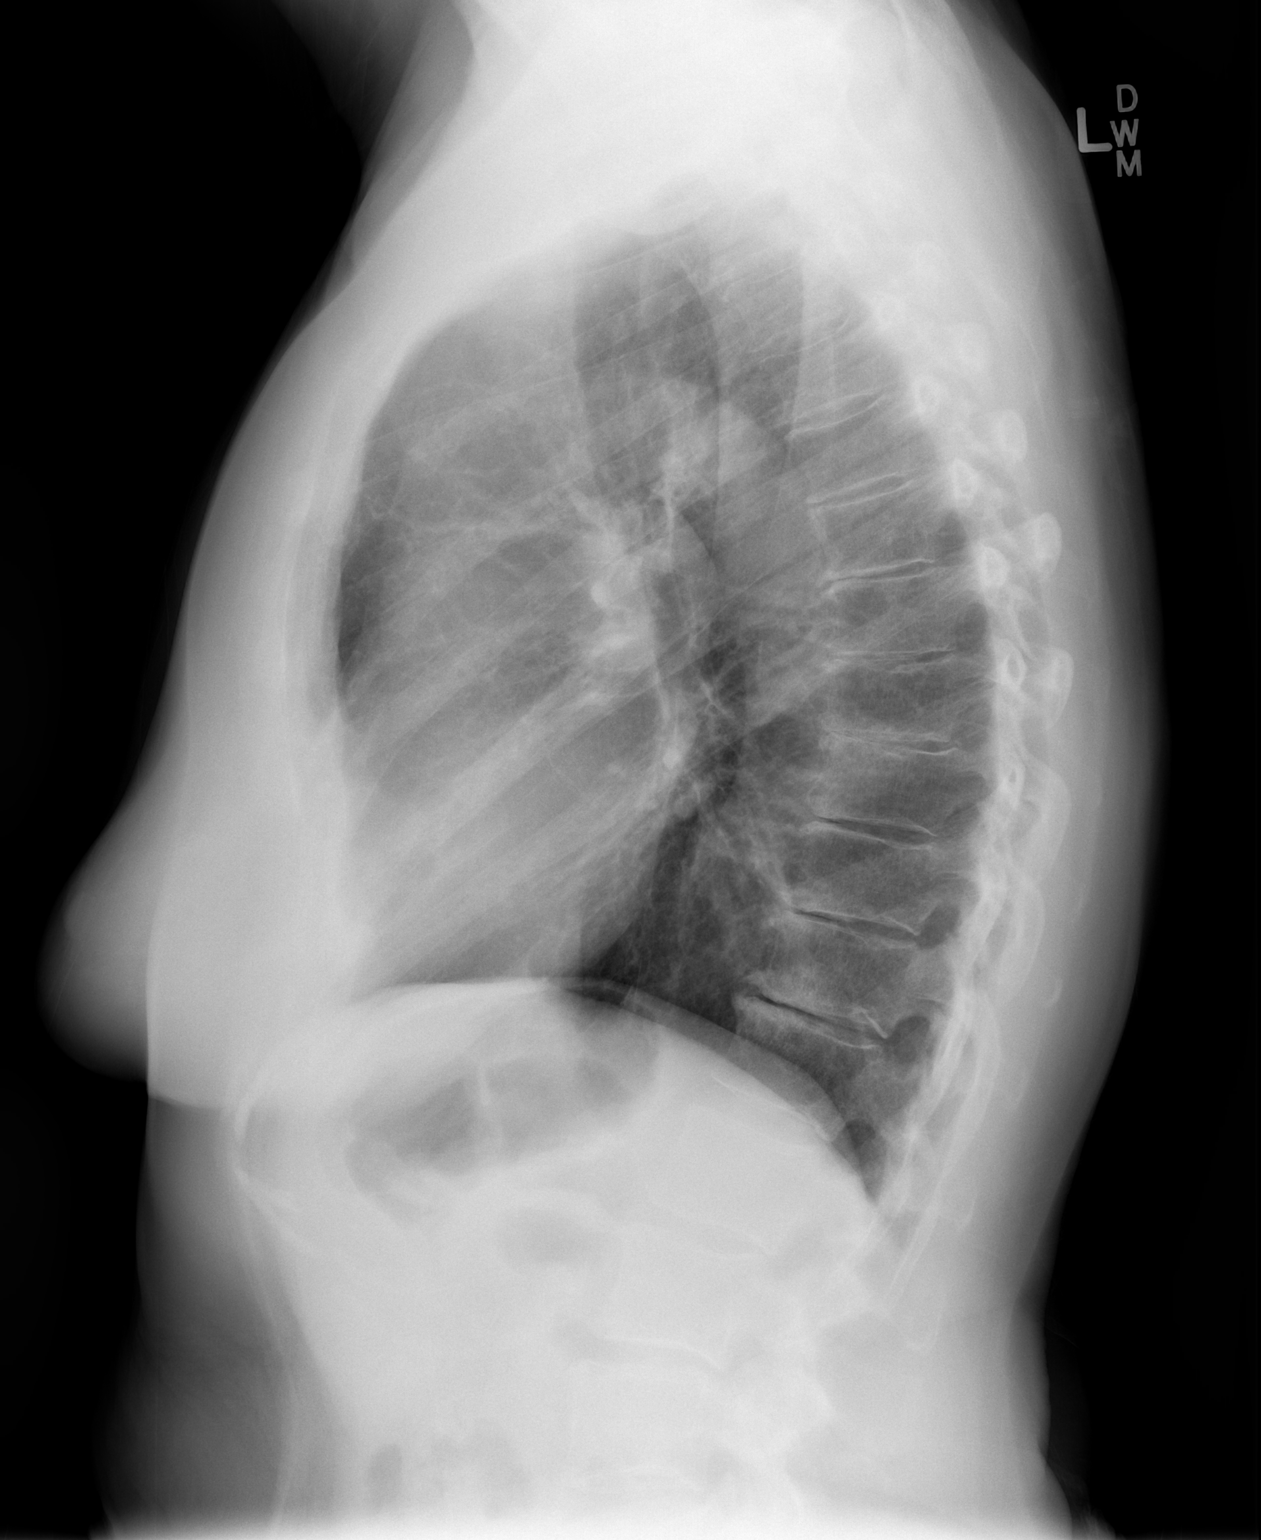

[2 of 2 positions shown; findings below may reference images not displayed]

FINDINGS: Lungs are adequately inflated without consolidation or effusion. The
cardiomediastinal silhouette is within normal. There is mild
degenerative change of the spine.
IMPRESSION: No active cardiopulmonary disease.

## 2015-12-26 MED FILL — ATENOLOL 25 MG TABLET: 25 | 30 days supply | Qty: 30 | Fill #6

## 2016-01-09 MED FILL — LISINOPRIL-HCTZ 20-12.5 MG: 20-12.5 | 90 days supply | Qty: 45 | Fill #3

## 2016-01-26 MED FILL — ATENOLOL 25 MG TABLET: 25 | 90 days supply | Qty: 90 | Fill #7

## 2016-02-09 MED FILL — CIPROFLOXACIN HCL 500 MG TA: 500 | 7 days supply | Qty: 14 | Fill #0

## 2016-02-14 MED FILL — TRAMADOL-ACETAMINOPHN 37.5-: 37.5-325 | 7 days supply | Qty: 30 | Fill #0

## 2016-02-27 MED FILL — CIPROFLOXACIN HCL 500 MG TA: 500 | 5 days supply | Qty: 10 | Fill #0

## 2016-04-01 MED FILL — LISINOPRIL-HCTZ 20-12.5 MG: 20-12.5 | 90 days supply | Qty: 45 | Fill #4

## 2016-04-23 MED FILL — ATENOLOL 25 MG TABLET: 25 | 60 days supply | Qty: 60 | Fill #8

## 2016-06-25 MED FILL — ATENOLOL 25 MG TABLET: 25 | 30 days supply | Qty: 30 | Fill #0

## 2016-06-25 MED FILL — LISINOPRIL-HCTZ 20-12.5 MG: 20-12.5 | 90 days supply | Qty: 45 | Fill #0

## 2016-07-24 MED FILL — ATENOLOL 25 MG TABLET: 25 | 30 days supply | Qty: 30 | Fill #1

## 2016-08-22 MED FILL — ATENOLOL 25 MG TABLET: 25 | 30 days supply | Qty: 30 | Fill #2

## 2016-09-20 MED FILL — ATENOLOL 25 MG TABLET: 25 | 30 days supply | Qty: 30 | Fill #3

## 2016-09-20 MED FILL — LISINOPRIL-HCTZ 20-12.5 MG: 20-12.5 | 90 days supply | Qty: 45 | Fill #1

## 2016-10-21 MED FILL — ATENOLOL 25 MG TABLET: 25 | 30 days supply | Qty: 30 | Fill #4

## 2016-11-21 MED FILL — ATENOLOL 25 MG TABLET: 25 | 30 days supply | Qty: 30 | Fill #5

## 2016-12-20 MED FILL — LISINOPRIL-HCTZ 20-12.5 MG: 20-12.5 | 90 days supply | Qty: 45 | Fill #2

## 2016-12-20 MED FILL — ATENOLOL 25 MG TABLET: 25 | 30 days supply | Qty: 30 | Fill #0

## 2017-01-21 MED FILL — ATENOLOL 25 MG TABLET: 25 | 30 days supply | Qty: 30 | Fill #1

## 2017-01-27 MED FILL — TRAMADOL-ACETAMINOPHN 37.5-: 37.5-325 | 8 days supply | Qty: 30 | Fill #0

## 2017-01-29 MED FILL — FLUTICASONE PROP 50 MCG SPR: 50 | 60 days supply | Qty: 16 | Fill #0

## 2017-01-29 MED FILL — LEVOCETIRIZINE 5 MG TABLET: 5 | 30 days supply | Qty: 30 | Fill #0

## 2017-02-19 MED FILL — ATENOLOL 25 MG TABLET: 25 | 30 days supply | Qty: 30 | Fill #2

## 2017-03-19 MED FILL — ATENOLOL 25 MG TABLET: 25 | 30 days supply | Qty: 30 | Fill #3

## 2017-03-19 MED FILL — LISINOPRIL-HCTZ 20-12.5 MG: 20-12.5 | 90 days supply | Qty: 45 | Fill #3

## 2017-03-19 MED FILL — TRAMADOL-ACETAMINOPHN 37.5-: 37.5-325 | 8 days supply | Qty: 30 | Fill #1

## 2017-04-22 MED FILL — TRAMADOL-ACETAMINOPHN 37.5-: 37.5-325 | 8 days supply | Qty: 30 | Fill #2

## 2017-04-22 MED FILL — ATENOLOL 25 MG TABLET: 25 | 30 days supply | Qty: 30 | Fill #4

## 2017-05-22 MED FILL — ATENOLOL 25 MG TABLET: 25 | 30 days supply | Qty: 30 | Fill #5

## 2017-06-05 MED FILL — traMADol HCL 50 MG TABS: 50 | 30 days supply | Qty: 30 | Fill #0

## 2017-06-05 MED FILL — LISINOPRIL-HCTZ 20-12.5 MG: 20-12.5 | 90 days supply | Qty: 90 | Fill #0

## 2017-06-18 MED FILL — ATENOLOL 25 MG TABLET: 25 | 90 days supply | Qty: 90 | Fill #0

## 2017-09-17 MED FILL — ATENOLOL 25 MG TABLET: 25 | 90 days supply | Qty: 90 | Fill #1

## 2017-12-02 MED FILL — LISINOPRIL-HCTZ 20-12.5 MG: 20-12.5 | 90 days supply | Qty: 90 | Fill #1

## 2017-12-02 MED FILL — traMADol HCL 50 MG TABS: 50 | 30 days supply | Qty: 30 | Fill #1

## 2017-12-18 MED FILL — ATENOLOL 25 MG TABLET: 25 | 90 days supply | Qty: 90 | Fill #2

## 2018-03-19 MED FILL — ATENOLOL 25 MG TABLET: 25 | 90 days supply | Qty: 90 | Fill #3

## 2018-06-01 MED FILL — LISINOPRIL-HCTZ 20-12.5 TAB: 20-12.5 | 90 days supply | Qty: 90 | Fill #0

## 2018-06-01 MED FILL — ATENOLOL 25 MG TABLET: 25 | 90 days supply | Qty: 90 | Fill #0

## 2018-06-01 MED FILL — traMADol HCL 50 MG TABS: 50 | 30 days supply | Qty: 30 | Fill #0

## 2018-06-01 MED FILL — CELECOXIB 200 MG CAP: 200 | 30 days supply | Qty: 30 | Fill #0

## 2018-09-15 MED FILL — ATENOLOL 25 MG TABLET: 25 | 90 days supply | Qty: 90 | Fill #1

## 2018-11-12 MED FILL — LISINOPRIL-HCTZ 20-12.5 MG: 20-12.5 | 90 days supply | Qty: 90 | Fill #1

## 2018-12-18 MED FILL — ATENOLOL 25 MG TABLET: 25 | 90 days supply | Qty: 90 | Fill #2
